# Patient Record
Sex: Male | Born: 1970
Health system: Southern US, Community
[De-identification: ages and names within clinical notes are randomized; demographics above are authoritative.]

## PROBLEM LIST (undated history)

## (undated) DIAGNOSIS — Z89511 Acquired absence of right leg below knee: Secondary | ICD-10-CM

## (undated) DIAGNOSIS — N2581 Secondary hyperparathyroidism of renal origin: Secondary | ICD-10-CM

## (undated) DIAGNOSIS — I779 Disorder of arteries and arterioles, unspecified: Secondary | ICD-10-CM

## (undated) DIAGNOSIS — Z8739 Personal history of other diseases of the musculoskeletal system and connective tissue: Secondary | ICD-10-CM

## (undated) DIAGNOSIS — Z8679 Personal history of other diseases of the circulatory system: Secondary | ICD-10-CM

## (undated) DIAGNOSIS — G4733 Obstructive sleep apnea (adult) (pediatric): Secondary | ICD-10-CM

## (undated) DIAGNOSIS — R112 Nausea with vomiting, unspecified: Secondary | ICD-10-CM

## (undated) DIAGNOSIS — Z9483 Pancreas transplant status: Secondary | ICD-10-CM

## (undated) DIAGNOSIS — Z87448 Personal history of other diseases of urinary system: Secondary | ICD-10-CM

## (undated) DIAGNOSIS — Z7960 Long term (current) use of unspecified immunomodulators and immunosuppressants: Secondary | ICD-10-CM

## (undated) DIAGNOSIS — E109 Type 1 diabetes mellitus without complications: Secondary | ICD-10-CM

## (undated) DIAGNOSIS — Z973 Presence of spectacles and contact lenses: Secondary | ICD-10-CM

## (undated) DIAGNOSIS — Z87828 Personal history of other (healed) physical injury and trauma: Secondary | ICD-10-CM

## (undated) DIAGNOSIS — N183 Chronic kidney disease, stage 3 unspecified: Secondary | ICD-10-CM

## (undated) DIAGNOSIS — N189 Chronic kidney disease, unspecified: Secondary | ICD-10-CM

## (undated) DIAGNOSIS — Z9889 Other specified postprocedural states: Secondary | ICD-10-CM

## (undated) DIAGNOSIS — K219 Gastro-esophageal reflux disease without esophagitis: Secondary | ICD-10-CM

## (undated) DIAGNOSIS — E113593 Type 2 diabetes mellitus with proliferative diabetic retinopathy without macular edema, bilateral: Secondary | ICD-10-CM

## (undated) DIAGNOSIS — N319 Neuromuscular dysfunction of bladder, unspecified: Secondary | ICD-10-CM

## (undated) DIAGNOSIS — Z9641 Presence of insulin pump (external) (internal): Secondary | ICD-10-CM

## (undated) DIAGNOSIS — Z940 Kidney transplant status: Secondary | ICD-10-CM

## (undated) DIAGNOSIS — D631 Anemia in chronic kidney disease: Secondary | ICD-10-CM

## (undated) DIAGNOSIS — F32A Depression, unspecified: Secondary | ICD-10-CM

## (undated) DIAGNOSIS — Z79899 Other long term (current) drug therapy: Secondary | ICD-10-CM

## (undated) DIAGNOSIS — L97509 Non-pressure chronic ulcer of other part of unspecified foot with unspecified severity: Secondary | ICD-10-CM

## (undated) HISTORY — PX: TRACHEOSTOMY: SUR1362

## (undated) HISTORY — PX: DIALYSIS FISTULA CREATION: SHX611

## (undated) HISTORY — PX: COMBINED KIDNEY-PANCREAS TRANSPLANT: SHX1382

## (undated) HISTORY — PX: KIDNEY TRANSPLANT: SHX239

## (undated) HISTORY — PX: BELOW KNEE LEG AMPUTATION: SUR23

---
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
## 1992-02-01 HISTORY — PX: FRACTURE SURGERY: SHX138

## 1992-02-01 HISTORY — PX: OTHER SURGICAL HISTORY: SHX169

## 2001-11-03 ENCOUNTER — Inpatient Hospital Stay (HOSPITAL_COMMUNITY): Admission: EM | Admit: 2001-11-03 | Discharge: 2001-11-09 | Payer: Self-pay | Admitting: Family Medicine

## 2001-11-04 ENCOUNTER — Encounter: Payer: Self-pay | Admitting: Family Medicine

## 2001-11-12 ENCOUNTER — Encounter: Admission: RE | Admit: 2001-11-12 | Discharge: 2001-11-12 | Payer: Self-pay | Admitting: Family Medicine

## 2003-03-04 ENCOUNTER — Encounter (INDEPENDENT_AMBULATORY_CARE_PROVIDER_SITE_OTHER): Payer: Self-pay | Admitting: Cardiology

## 2003-03-04 ENCOUNTER — Inpatient Hospital Stay (HOSPITAL_COMMUNITY): Admission: EM | Admit: 2003-03-04 | Discharge: 2003-03-13 | Payer: Self-pay | Admitting: *Deleted

## 2003-03-11 ENCOUNTER — Encounter: Payer: Self-pay | Admitting: Internal Medicine

## 2003-05-08 ENCOUNTER — Encounter (INDEPENDENT_AMBULATORY_CARE_PROVIDER_SITE_OTHER): Payer: Self-pay | Admitting: *Deleted

## 2003-05-08 ENCOUNTER — Ambulatory Visit (HOSPITAL_COMMUNITY): Admission: RE | Admit: 2003-05-08 | Discharge: 2003-05-08 | Payer: Self-pay | Admitting: *Deleted

## 2003-05-09 ENCOUNTER — Ambulatory Visit (HOSPITAL_COMMUNITY): Admission: RE | Admit: 2003-05-09 | Discharge: 2003-05-09 | Payer: Self-pay | Admitting: Vascular Surgery

## 2003-05-29 ENCOUNTER — Ambulatory Visit (HOSPITAL_COMMUNITY): Admission: RE | Admit: 2003-05-29 | Discharge: 2003-05-29 | Payer: Self-pay | Admitting: Vascular Surgery

## 2003-07-29 ENCOUNTER — Ambulatory Visit (HOSPITAL_COMMUNITY): Admission: RE | Admit: 2003-07-29 | Discharge: 2003-07-29 | Payer: Self-pay | Admitting: Vascular Surgery

## 2003-08-05 ENCOUNTER — Ambulatory Visit (HOSPITAL_COMMUNITY): Admission: RE | Admit: 2003-08-05 | Discharge: 2003-08-05 | Payer: Self-pay | Admitting: Vascular Surgery

## 2003-10-28 ENCOUNTER — Encounter: Admission: RE | Admit: 2003-10-28 | Discharge: 2003-10-28 | Payer: Self-pay | Admitting: Nephrology

## 2003-12-08 ENCOUNTER — Emergency Department (HOSPITAL_COMMUNITY): Admission: EM | Admit: 2003-12-08 | Discharge: 2003-12-08 | Payer: Self-pay | Admitting: Emergency Medicine

## 2004-11-07 ENCOUNTER — Emergency Department (HOSPITAL_COMMUNITY): Admission: EM | Admit: 2004-11-07 | Discharge: 2004-11-07 | Payer: Self-pay | Admitting: Emergency Medicine

## 2004-12-13 ENCOUNTER — Ambulatory Visit (HOSPITAL_BASED_OUTPATIENT_CLINIC_OR_DEPARTMENT_OTHER): Admission: RE | Admit: 2004-12-13 | Discharge: 2004-12-13 | Payer: Self-pay | Admitting: Nephrology

## 2004-12-19 ENCOUNTER — Ambulatory Visit: Payer: Self-pay | Admitting: Internal Medicine

## 2004-12-31 DIAGNOSIS — Z9483 Pancreas transplant status: Secondary | ICD-10-CM

## 2004-12-31 HISTORY — DX: Pancreas transplant status: Z94.83

## 2005-01-26 ENCOUNTER — Ambulatory Visit (HOSPITAL_BASED_OUTPATIENT_CLINIC_OR_DEPARTMENT_OTHER): Admission: RE | Admit: 2005-01-26 | Discharge: 2005-01-26 | Payer: Self-pay | Admitting: Nephrology

## 2005-01-30 ENCOUNTER — Ambulatory Visit: Payer: Self-pay | Admitting: Internal Medicine

## 2006-03-09 ENCOUNTER — Ambulatory Visit (HOSPITAL_COMMUNITY): Admission: RE | Admit: 2006-03-09 | Discharge: 2006-03-09 | Payer: Self-pay | Admitting: Ophthalmology

## 2006-11-24 ENCOUNTER — Emergency Department (HOSPITAL_COMMUNITY): Admission: EM | Admit: 2006-11-24 | Discharge: 2006-11-24 | Payer: Self-pay | Admitting: Emergency Medicine

## 2010-01-31 HISTORY — PX: OTHER SURGICAL HISTORY: SHX169

## 2010-06-18 NOTE — Consult Note (Signed)
NAME:  Tony, Fisher                        ACCOUNT NO.:  [id number]   MEDICAL RECORD NO.:  [id number]                   PATIENT TYPE:  INP   LOCATION:  0164                                 FACILITY:  [facility]   PHYSICIAN:  Donato Heinz, M.D.             DATE OF BIRTH:  1970-05-15   DATE OF CONSULTATION:  03/04/2003  DATE OF DISCHARGE:                                   CONSULTATION   REASON FOR CONSULTATION:  Acute renal failure.   CONSULTING PHYSICIAN:  Dr. Maryland Pink.   HISTORY OF PRESENT ILLNESS:  [name] is a 40 year old white male with  past medical history significant for type 1 diabetes mellitus for 20 years  who was not followed by primary care physician for five years or more.  He  also has a past medical history of chronic kidney disease, stage III, with  baseline creatinine of 2.1 last noted approximately a year ago when he was  hospitalized for an osteomyelitis of his leg.  Since then, he was noted to  have hypertension, but had not been taking blood pressure medications and  had not seen his primary care physician.  He presented to [facility]  Emergency Room with a two month history of increased swelling and a one  month history of increasing shortness of breath, dyspnea on exertion,  orthopnea, PND, and congestion.  In the emergency room, he was noted to have  congestive heart failure, hypoxia with a pulse ox of 90% on 40% face mask,  and also had a BUN and creatinine of 45 and 6.2, respectively.  We were  asked to evaluate the patient for his acute on chronic renal failure.   ALLERGIES:  No known drug allergies.   PAST MEDICAL HISTORY:  1. Insulin-dependent diabetes mellitus for 20 years.  2. Chronic kidney disease, baseline creatinine 2.1 in October 2003,     presumably secondary to diabetic nephropathy.  Creatinine clearance of 54     cc a minute.  He had 1.6 g of protein for 24 hours.  3. Osteomyelitis.  4. Hypertension, not on any medications,  diagnosed a year ago.   OUTPATIENT MEDICATIONS:  Insulin 70/30 50 units b.i.d.   INPATIENT MEDICATIONS:  1. Lasix 80 mg IV q.8h.  2. Hydralazine 50 mg b.i.d.  3. Insulin sliding scale.  4. Nitroglycerin drip.  5. Tylenol p.r.n.   FAMILY HISTORY:  His mother is 29, in good health except she was recently  diagnosed with connective tissue disorder.  Father is alive at age 29, does  not know much about his health.  One sister in good health.  No family  history of kidney disease or heart disease.   SOCIAL HISTORY:  He is single.  Denies tobacco or alcohol.  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  He went to [employer] and then to [employer].  Denies any drug use.  REVIEW OF SYSTEMS:  As per HPI.  He denies any blurry vision, photophobia.  ENT:  Denies any tinnitus, dysphagia, odynophagia.  CARDIAC:  He has some  orthopnea, PND, but no chest pain or palpitations.  PULMONARY:  Has some  shortness of breath, dyspnea on exertion for the last two months.  GASTROINTESTINAL:  No nausea, vomiting, hematochezia, melena, or bright red  blood per rectum.  GENITOURINARY:  No dysuria, pyuria, hematuria, urgency,  frequency, or oliguria.  He has had some swelling of his lower extremities  for the last two months.  HEMATOLOGIC:  Denies any abnormal bleeding or  bruising.  NEUROLOGIC:  Denies any numbness, tingling, weakness, ataxia,  aphasia.  DERMATOLOGIC:  Denies any rashes, lumps, or bumps.  ENDOCRINE:  Denies any pyuria, polydipsia, polyphagia.  INFECTIOUS:  No diaphoresis, no  night sweats, no productive cough.  All other systems negative.   PHYSICAL EXAMINATION:  GENERAL:  This is a well-developed, thin man in mild  respiratory distress.  VITAL SIGNS:  Temperature 97.8, pulse 105, blood pressure 145/100,  respiratory rate is 20, pulse ox is 99% on 50% face mask.  His urine output  is 1720 cc over the last eight hours.  HEENT:  Normocephalic,  atraumatic.  Pupils equal, round, reactive to light.  Extraocular movements were intact.  No icterus.  Oropharynx with dry mucous  membranes.  LUNGS:  He had bibasilar crackles, right greater than left, with decreased  breath sounds at the left base.  CARDIAC:  He is tachycardic at 105.  There is no precordial rub present at  this time.  ABDOMEN:  Normoactive bowel sounds, soft, nontender, nondistended.  EXTREMITIES:  He has 3+ pitting edema up to his thighs.   LABORATORY DATA:  Sodium 141, potassium 5.7, chloride 110, CO2 23, BUN 48,  creatinine 6.4, glucose 220.  White blood cell count 6.6, hemoglobin 10,  platelets 314.  CPK 506, MB 18.7, relative index 3.7.   ASSESSMENT AND PLAN:  1. Acute on chronic renal failure.  It is unclear the underlying     exacerbation, but the patient does have known diabetic nephropathy dating     back to October 2003, when his creatinine clearance was 54 cc a minute     and baseline creatinine was 2.1.  He had 1.6 g of proteinuria for 24     hours at that hospitalization, and he has not seen a physician since that     time, nor was he continuing with blood pressure medications.  At this     present time, his urine output is increased on 80 mg of intravenous Lasix     q.8h.  However, he still has some pulmonary edema and anasarca.  Plan at     this time is to increase his Lasix to 160 mg q.6h., as well as add     Zaroxolyn 5 mg orally to increase his diuresis.  We will also recommend a     renal ultrasound as well as strict I's and O's.  At the present time     there is no emergent indication for dialysis, although it may be in his     very near future.  We will recommend transferring the patient to [facility] when a bed is available in case he will need dialysis in     the near future.  However, we will continue to follow him.  2. Elevated CPKs.  Agree with cardiology consult and  echocardiogram.  Will    need to continue to follow serial  enzymes.  If he will need a cardiac     catheterization in the near future, this will increase his risk for     requiring hemodialysis with superimposed contrasted nephropathy.  3. Hypertension.  He is on hydralazine.  We will continue to follow along     with diuresis.  Would hold off on an ACE or an ARB for now given his     acute on chronic renal failure.  4. Anemia.  This is likely secondary to his chronic kidney disease.  We will     guaiac his stools, check iron studies, and he may benefit from Epogen     therapy.  5. Diabetes mellitus.  Per primary services is on insulin.  We will continue     to follow.  6. Hyperkalemia.  We will follow this after we increase his dose of Lasix     and add Zaroxolyn, and continue to follow.  7. Disposition.  Again, it may be prudent to transfer the patient to [facility] when a bed is available, although he does not need dialysis     at this time, but he may need a heart catheterization depending on     cardiology workup.  We will continue to follow along.                                               Donato Heinz, M.D.    JC/MEDQ  D:  03/04/2003  T:  03/04/2003  Job:  [id number]

## 2010-06-18 NOTE — Op Note (Signed)
NAME:  Tony Fisher, Tony Fisher                        ACCOUNT NO.:  [id number]   MEDICAL RECORD NO.:  [id number]                   PATIENT TYPE:  OIB   LOCATION:  2899                                 FACILITY:  [facility]   PHYSICIAN:  Judeth Cornfield. Scot Dock, M.D.        DATE OF BIRTH:  23-May-1970   DATE OF PROCEDURE:  05/09/2003  DATE OF DISCHARGE:  05/09/2003                                 OPERATIVE REPORT   PREOPERATIVE DIAGNOSIS:  Chronic renal failure.   POSTOPERATIVE DIAGNOSIS:  Chronic renal failure.   PROCEDURE:  1. Ultrasound of bilateral internal jugular veins.  2. Placement of a right IJ Diatek catheter (28 cm).  3. Thrombectomy of right forearm AV fistula.   SURGEON:  Judeth Cornfield. Scot Dock, M.D.   ASSISTANT:  Nurse.   ANESTHESIA:  Local with sedation.   TECHNIQUE:  The patient was taken to the operating room and sedated by  Anesthesia.  I used the ultrasound scanner to identify both internal jugular  veins.  The neck and upper chest were then prepped and draped in the usual  sterile fashion.  After the skin was infiltrated with 1% lidocaine, the  right internal jugular vein was cannulated and a guidewire introduced into  the superior vena cava under fluoroscopic control.  The tract over the wire  was then dilated and then a dilator and peel-away sheath were passed over  the wire and the wire and dilator removed.  The catheter was passed through  the peel-away sheath and positioned in the right atrium.  The exit site of  the catheter was selected and the skin anesthetized between the two areas.  The catheter was then brought through the tunnel, cut to the appropriate  length, and the distal ports were attached.  Both ports withdrew easily,  were then flushed with heparinized saline, and filled with concentrated  heparin.  The catheter was secured at its exit site with a 3-0 nylon suture.  The IJ cannulation site was closed with a 4-0 subcuticular stitch.  A  sterile dressing  was applied.   Next, the right forearm was reprepped and draped in the usual sterile  fashion.  This patient had had a fistula placed a few weeks ago and had been  seen in the office today and it was clotted.  I explained that I thought it  was unlikely, if it clotted this soon, that it would be a good long-term  result, but I thought it was worth a simple thrombectomy.  The previous  incision was anesthetized with 1% lidocaine.  The incision was opened and  the vein was dissected free. A transverse venotomy was made and then using a  #3 Fogarty catheter, the catheter was passed the entire length and the clot  was retrieved,and note, the vein was fairly sclerotic throughout and quite  small.  I also performed a thrombectomy at the arterial end and established  good inflow.  The arteriotomy was then  closed with a running 6-0 Prolene  suture.  At completion, there was a pulse in the fistula; it did not have an  especially good thrill.  This would be consistent with sclerotic disease  throughout the vein proximally.  Hemostasis was obtained in the wound, and  the  heparin was partially reversed with protamine.  The wound was closed with a  4-0 subcuticular stitch.  A sterile dressing was applied.  The patient  tolerated the procedure well and was transferred to the recovery room in  satisfactory condition. All needle and sponge counts were correct.                                               Judeth Cornfield. Scot Dock, M.D.    CSD/MEDQ  D:  05/09/2003  T:  05/11/2003  Job:  [id number]

## 2010-06-18 NOTE — Op Note (Signed)
NAME:  Tony Fisher, Tony Fisher                        ACCOUNT NO.:  [id number]   MEDICAL RECORD NO.:  [id number]                   PATIENT TYPE:  OIB   LOCATION:  2852                                 FACILITY:  [facility]   PHYSICIAN:  Jessy Oto. Fields, MD               DATE OF BIRTH:  05/01/1970   DATE OF PROCEDURE:  05/29/2003  DATE OF DISCHARGE:                                 OPERATIVE REPORT   PREOPERATIVE DIAGNOSIS:  End-stage renal disease.   POSTOPERATIVE DIAGNOSIS:  End-stage renal disease.   PROCEDURE:  Left Cimino fistula.   ANESTHESIA:  Local with IV sedation.   ASSISTANT:  Leta Baptist, P.A.   INDICATIONS:  The patient is a 40 year old male who previously had a right  Cimino fistula which has occluded.  He now presents for placement of a left  Cimino fistula.   OPERATIVE FINDINGS:  1. A 3 mm cephalic vein.  2. A 1.5-2 mm radial artery.   OPERATIVE DETAILS:  After obtaining informed consent, the patient was taken  to the operating room.  The patient was placed in the supine position on the  operating room table.  Next the patient's entire left upper extremity was  prepped and draped in the usual sterile fashion.  Local anesthesia was  infiltrated midway between the cephalic vein and the radial artery.  A  longitudinal incision was made in this location at the level of the wrist.  This was carried down through the subcutaneous tissues.  The cephalic vein  was dissected free circumferentially for approximately 4 cm.  The cephalic  vein was approximately 3 mm in diameter.  Next the radial artery was  dissected free circumferentially on the medial portion of the incision.  The  radial artery was approximately 1.5-2 mm in diameter. This was quite small.  Some papaverine was placed on this to try to decrease spasm.   Next the patient was given 3000 units of intravenous heparin.  The cephalic  vein was then transected distally in the wrist and ligated with a silk tie.  The  vein was then swung over to the level of the artery.  The artery was  controlled proximally and distally with serrefine clamps.  The vein was  dilated up with heparinized saline.  A longitudinal arteriotomy was then  made in the radial artery and an end of vein to side of artery anastomosis  created using a running 7-0 Prolene suture.  Just prior to completion of the  anastomosis, the artery was forebled, backbled, thoroughly flushed.  After  the anastomosis was secure, there was a palpable pulse within the fistula.  There was a good Doppler signal in the fistula all the way up to the level  of the elbow.  Hemostasis was obtained.  The subcutaneous tissues were then  reapproximated using a running 3-0 Vicryl suture.  The skin was then closed  with a  4-0 Vicryl subcuticular  stitch.  The patient tolerated the procedure well,  and there were no complications.  Instrument, sponge, and needle count was  correct at the end of the case.  The patient was taken to the recovery room  in stable condition.                                               Jessy Oto. Fields, MD    CEF/MEDQ  D:  05/29/2003  T:  05/29/2003  Job:  [id number]

## 2010-06-18 NOTE — Procedures (Signed)
Tony Fisher, Tony Fisher             ACCOUNT NO.:  [id number]   MEDICAL RECORD NO.:  [id number]          PATIENT TYPE:  OUT   LOCATION:  SLEEP CENTER                 FACILITY:  [facility]   PHYSICIAN:  Clinton D. Annamaria Boots, M.D. DATE OF BIRTH:  18-Dec-1970   DATE OF STUDY:  12/13/2004                              NOCTURNAL POLYSOMNOGRAM   REFERRING PHYSICIAN:  Dr. Fleet Contras.   DATE OF STUDY:  December 13, 2004.   INDICATION FOR STUDY:  Hypersomnia with sleep apnea.   EPWORTH SLEEPINESS SCORE:  7/24.   BMI:  18.8.   WEIGHT:  147 pounds.   MEDICATIONS:  Include insulin, atenolol and a renal vitamin.   SLEEP ARCHITECTURE:  Total sleep time 392 minutes with sleep efficiency 95%.  Stage I was 2%, stage II 78%, stages III and IV 2%, REM 18% of total sleep  time. Sleep latency 13 minutes, REM latency 172 minutes, awake after sleep  onset 7.5 minutes, arousal index increased at 44. No bedtime medication  taken.   RESPIRATORY DATA:  NPSG protocol. Apnea/hypopnea index (AHI, RDI) 40.1  obstructive events per hour indicating moderately severe obstructive sleep  apnea/hypopnea syndrome. There were 4 central apneas, 190 obstructive  apneas, 16 mixed apneas and 52 hypopneas. Events were positional. While  supine, AHI was 80.4 per hour. REM AHI 5.2 per hour.   OXYGEN DATA:  Moderate to loud snoring with oxygen desaturation to a nadir  of 81% on room air. Mean oxygen saturation through the study was 97% on room  air.   CARDIAC DATA:  Normal sinus rhythm.   MOVEMENT/PARASOMNIA:  A total of 119 limb jerks were reported of which only  4 were associated with arousal or awakening, insignificant impact on sleep.   IMPRESSION/RECOMMENDATIONS:  1.  Moderately severe obstructive sleep apnea/hypopnea syndrome, AHI 40.1      per hour with events more common while supine. Snoring was moderately      loud and oxygen desaturated to 81% during events.  2.  Consider return for C-PAP titration or evaluate  for alternative      therapies as appropriate. Sleeping off flat of back would be a      conservative intervention.      Clinton D. Annamaria Boots, M.D.  [place], [facility]  Electronically Signed     CDY/MEDQ  D:  12/19/2004 10:58:19  T:  12/19/2004 23:15:10  Job:  [id number]
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
## 2010-06-18 NOTE — Op Note (Signed)
NAME:  Tony Fisher, Tony Fisher                        ACCOUNT NO.:  [id number]   MEDICAL RECORD NO.:  [id number]                   PATIENT TYPE:  INP   LOCATION:  4735                                 FACILITY:  [facility]   PHYSICIAN:  Jessy Oto. Fields, MD               DATE OF BIRTH:  1970/04/01   DATE OF PROCEDURE:  03/10/2003  DATE OF DISCHARGE:                                 OPERATIVE REPORT   PROCEDURE PERFORMED:  1. Insertion of left internal jugular vein Diatek catheter.  2. Bilateral neck ultrasound.  3. Insertion of right arm Cimino fistula.   PREOPERATIVE DIAGNOSIS:  End-stage renal disease.   POSTOPERATIVE DIAGNOSIS:  End-stage renal disease.   SURGEON:  Jessy Oto. Fields, MD   ASSISTANT:  Mardene Celeste, N.P.   ANESTHESIA:  Local with IV sedation.   INDICATIONS FOR PROCEDURE:  The patient is a 40 year old male with end-stage  renal disease who requires long term hemodialysis access.   OPERATIVE FINDINGS:  1. A 4 mm right cephalic vein.  2. A patent left internal jugular vein.  3. A 23 cm Diatek catheter.   DESCRIPTION OF PROCEDURE:  After obtaining informed consent, the patient was  taken to the operating room.  The patient was placed in supine position on  the operating table.  Next, the patient's entire neck and chest were prepped  and draped in the usual sterile fashion.  A Site Right ultrasound was used  to localize the right internal jugular vein.  This was small in diameter and  just adjacent to the carotid artery.  Several attempts were made to  cannulate the jugular vein under ultrasound guidance but this was  unsuccessful.  Therefore, attempts on this side were aborted and the Site  Right was moved to the left side.  The left internal jugular vein was noted  to be widely patent and easily compressible with normal respiratory  variability.  Next, local anesthesia was infiltrated over the left internal  jugular vein and this was easily cannulated under  ultrasound guidance.  Next  a 0.035 J-tip guidewire was threaded through the introducer needle using a  modified Seldinger technique and a guidewire was placed down into the  inferior vena cava under fluoroscopic guidance.  Next, sequential 12, 14 and  15 French dilators were placed over the guidewire down into the right  atrium.  The guidewire and dilator were then removed and a 23 cm Diatek  catheter was then placed through the peel-away sheath into the right atrium.  The peel-away sheath was removed and  the catheter was tunneled out  subcutaneously over the left clavicle.  The catheter was cut to length and  the hub attached.  The catheter was noted to flush and draw easily.  The tip  was confirmed under fluoroscopy to be in the right atrium and there were no  kinks throughout the course of the catheter.  The  catheter was then loaded  with concentrated heparin solution.  The catheter was sutured to the skin  with nylon sutures.  The neck insertion site was closed with a Vicryl  stitch.  Dry sterile dressing was applied to the neck.   Next attention was turned to the right arm.  The patient's entire right  upper extremity was prepped and draped in the usual sterile fashion.  A  tourniquet was placed around the forearm to dilate the cephalic vein for  localization.  Next, local anesthesia was infiltrated midway between the  cephalic vein and the radial artery.  An incision was then made in  longitudinal fashion near the wrist.  This was carried down through the  subcutaneous tissues and the cephalic vein was dissected free  circumferentially for approximately 4 cm.  Next, the radial artery was  dissected free just medial to this. The radial artery was quite small and  approximately 1.5 to 2 mm in diameter.  Papaverine was placed on the radial  artery to try to reduce any spasm.  Next, the patient was given 5000 units  of intravenous heparin.  The distal cephalic vein was transected and  swung  over to the area of the radial artery. The radial artery was controlled  proximally and distally with vessel loops.  The patient was given 5000 units  of intravenous heparin prior to clamping the artery.  Next a longitudinal  arteriotomy was made in the radial artery and an end of vein to side of  artery anastomosis was constructed using a running 7-0 Prolene suture.  Prior to completion of the anastomosis the usual flushing procedures were  performed.  The anastomosis was then secured.  Flow was restored to the  distal radial artery and up the fistula and there was noted to be a palpable  thrill within the fistula immediately.  Next, hemostasis was obtained.  The  subcutaneous tissues were then reapproximated with running Vicryl stitch.  The skin was then closed with a 4-0 Vicryl subcuticular stitch.  The patient  tolerated the procedure well.  There were no complications.  The sponge,  needle and instrument counts were correct at the end of the case.  The  patient was then transferred to the recovery room in stable condition.                                               Jessy Oto. Fields, MD    CEF/MEDQ  D:  03/10/2003  T:  03/11/2003  Job:  [id number]

## 2010-06-18 NOTE — Consult Note (Signed)
NAME:  Tony, Fisher                        ACCOUNT NO.:  [id number]   MEDICAL RECORD NO.:  [id number]                   PATIENT TYPE:  INP   LOCATION:  0164                                 FACILITY:  [facility]   PHYSICIAN:  Virgilio Fisher. Tony Dural, MD             DATE OF BIRTH:  03-Sep-1970   DATE OF CONSULTATION:  DATE OF DISCHARGE:                                   CONSULTATION   IDENTIFYING DATA AND CHIEF COMPLAINT:  Tony Fisher is a 40 year old white  man who is admitted to [facility] with acute renal failure and the  complications stemming wherefrom.   HISTORY OF PRESENT ILLNESS:  The patient has a history of Type II diabetes  mellitus.  He presented to the emergency department this evening with a one-  month history of lower extremity edema and a one to two-week history of  progressive dyspnea.  It has come to the point where he has been unable to  sleep due to dyspnea.  He notes orthopnea and paroxysmal nocturnal dyspnea.  There has been no chest pain, heaviness, pressure or squeezing, nor has  there been any pleuritic chest pain.  He denies fever, chills and weight  loss.  There has been no change in his urination.   The patient has no history of cardiac disease including no history of  myocardial infarction, coronary artery disease, congestive heart failure, or  arrhythmia.  There is no history of hypertension, hyperlipidemia, smoking or  family history of early coronary artery disease.   MEDICATIONS:  The patient's only medication is insulin.   ALLERGIES:  The patient is not allergic to any medications.   FAMILY HISTORY:  Family history is unremarkable.  There is no history of  diabetes mellitus or heart disease.   PAST SURGICAL HISTORY:  Shoulder surgery in 1994.   ACCIDENTS AND INJURIES:  Significant injuries; none.   HABITS:  The patient neither smokes or drinks.   SOCIAL HISTORY:  The patient lives alone.  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  His  closest relatives in town are his grandparents.   REVIEW OF SYSTEMS:  Review of systems reveals no problems related to his  head, eyes, ears, nose, mouth, throat, lungs, gastrointestinal system,  genitourinary system, or extremities.  There is no history of neurological  or psychiatric disorder.   PHYSICAL EXAMINATION:  VITAL SIGNS:  Blood pressure 152/111, pulse 110 and regular, respirations  22, and temperature 97.2.  GENERAL APPEARANCE:  The patient is a young white man in mild respiratory  discomfort.  He is alert, oriented, responsive and appropriate.  HEENT:  Head, eyes, nose and mouth are normal.  NECK:  The neck is without thyromegaly.  Carotid pulses are palpable  bilaterally.  Jugular venous distention is normal.  HEART:  Cardiac examination reveals a normal S1 and S2.  There is no gallop  or click.  A friction rub  heard by an earlier examiner is not heard by this  examiner.  Cardiac rhythm is regular.  CHEST:  No chest wall tenderness is noted.  LUNGS:  Examination of the lungs reveals diminished breath sounds diffusely.  There are no rales.  ABDOMEN:  The abdomen is soft and nontender.  There is no mass,  hepatosplenomegaly, bruit, distention, rebound, or rigidity.  RECTAL AND GENITALIA:  Rectal and genital examinations are not performed as  they are not pertinent to the reason for acute care hospitalization.  EXTREMITIES:  Examination of the lower extremities reveals pitting edema to  the lower torso.  NEUROLOGIC EXAMINATION:  Brief screening neurologic survey demonstrates no  focal neurologic findings.   LABORATORY DATA:  The electrocardiogram reveals sinus tachycardia and  nonspecific T-wave flattening in the lateral leads.  the chest radiograph  reveals congestive heart failure and bilateral pleural effusions.  White  count if 7.3 with a hemoglobin of 11.3 and a hematocrit of 33.3.  Potassium  is 5.5, BUN 45 and creatinine 6.2.  The initial CK was 506 with a CK MB  of  18.7 and relative index of 3.7.  Troponin is 0.05.  BNP 1,630.  Urinary  studies are pending at the time of this dictation.   IMPRESSION:  1. Acute renal failure.  2. Congestive heart failure due to fluid overload.  3. Bilateral pleural effusion.  4. Anasarca.  5. Abnormal cardiac enzymes probably due to renal failure and not cardiac     injury.  6. Possible pericardial effusion.  7. Hypertension.  8. Type II diabetes mellitus.  9. Anemia.   RECOMMENDATIONS:  1. Intensive care unit.  2. Serial cardiac enzymes.  3. Blood pressure control - hydralazine intravenous nitroglycerin are good     choices.  4. Pulmonary consult - consider therapeutic thoracentesis.  5. Echocardiogram.  6. Nephrology consult - dialysis.  7. Diuresis.  8. Inputs and outputs, plus daily weights.                                               Virgilio Fisher. Tony Dural, MD    MSC/MEDQ  D:  03/03/2003  T:  03/04/2003  Job:  [id number]   cc:   Annita Brod, M.D.

## 2010-06-18 NOTE — Discharge Summary (Signed)
NAME:  Tony Fisher, Tony Fisher                        ACCOUNT NO.:  [id number]   MEDICAL RECORD NO.:  [id number]                   PATIENT TYPE:  INP   LOCATION:  [id number]                                 FACILITY:  [facility]   PHYSICIAN:  Sheila Oats, M.D.             DATE OF BIRTH:  April 25, 1970   DATE OF ADMISSION:  03/04/2003  DATE OF DISCHARGE:  03/13/2003                                 DISCHARGE SUMMARY   DISCHARGE DIAGNOSES:  1. End-stage renal disease  2. Congestive heart failure.  3. Bilateral pleural effusions.  4. Anasarca.  5. Abnormal cardiac enzymes -- per cardiology, probably due to renal failure     but not cardiac injury.  6. Hypertension.  7. Diabetes mellitus.  8. Anemia.  9. Hyperkalemia.   PROCEDURES:  1. Internal jugular central line/Diatek catheter -- March 10, 2003.  2. Status post new arteriovenous fistula -- right wrist, March 10, 2003.  3. Status post dialysis.  4. Vascular evaluation with ultrasound -- negative for pseudoaneurysm,     apparent hematoma in right wrist.  5. Adenosine stress test -- ejection fraction of 34%, query inferior     infarct, no evidence of ischemia, diffuse hypokinesis.  6. Echocardiogram -- ejection fraction 35% to 40%, moderate mitral     regurgitation.   HISTORY:  The patient is a 40 year old white male with history of type 1  diabetes mellitus, who presented with shortness of breath and dyspnea on  exertion for 1 week, which progressed to shortness of breath at rest.  He  was unable to lie down to sleep -- he had PND and orthopnea, for about a  month.  He reported swelling in his lower extremities for 2 months and  denied any history of similar symptoms.  He denied chest pain and also  denied pleuritic pain.  Tony Fisher also denied fevers, also denied any  change in his urine output.   PHYSICAL EXAMINATION:  Upon admission, the pertinent findings in his exam  included a blood pressure of 132/111, and his O2 saturations 90%  on a 40%  face mask.  His heart rate was 110 and his respiratory rate was 22.  The  neck exam revealed JVP about 11 cm and on his lungs, he had decrease breath  sounds bilaterally with E-to-A changes at the bases.  His heart was regular,  tachycardic, and it was noted that he had a friction rub -- pericardial  apex, also he had pitting edema in his back and 3+ pitting edema up to his  upper thighs.   LABORATORY DATA:  On his labs, he had a BNP of 1630 and the CK was 506, MB  of 19.7, relative index of 3.7 and a troponin of 0.05.  His LFTs were within  normal limits and his BUN was 45 with a creatinine of 6.2.   His EKG showed a sinus tachycardia at a rate of 110 and  he had no acute ST  changes, he had changes consistent with left atrial enlargement and left  axis deviation.   The chest x-ray showed large bilateral pleural effusions and the heart was  not well-visualized.   It was noted that the patient's last creatinine in October of 2003 was 1.7  and the rest of his chemistries showed a sodium of 141, potassium 5.5,  chloride 108 and a CO2 of 25.  His hemoglobin was 11.3 with a hematocrit of  33.3, his white cell count 7.3 and platelet count of 331,000.   HOSPITAL COURSE:  PROBLEM #1 - END-STAGE RENAL DISEASE:  Upon admission, the  patient was discussed with nephrology and Dr. Donato Heinz.  Dr.  Elissa Hefty impression was that the patient had acute-on-chronic renal  failure; he had nondiabetic nephropathy and in October of 2003, had a  creatinine clearance of 54 mL/min and proteinuria of 1.6 g.  The patient had  not seen a physician for a long time and had not continued his blood  pressure medications.  The patient was admitted to the ICU and was started  on Lasix 80 mg IV q.8 h. with an increase in his urine output and the  nephrologist increased the Lasix 160 mg IV q.6 h. and added Zaroxolyn for  better diuresis.  A renal ultrasound was also done and it showed echogenic   kidneys, medical renal disease, no hydronephrosis and the bilateral pleural  effusions were also noted.  The nephrologist also recommended that the  patient be transferred to [facility] and he was subsequently transferred  from [facility] to the [facility] ICU.  As per the above interventions, the  nephrologist determined that Tony Fisher would need long-term diagnosis and  so an internal jugular central line/Diatek catheter was placed, as above,  and subsequently, a new A-V fistula was done by vascular surgery/CVTS.  The  patient was dialyzed during his hospital stay and his last BUN/creatinine  were 16 and 6.1, respectively.  The patient was placed on a renal/diabetic  diet and he was put on a Monday/Wednesday/Friday dialysis schedule and he  was to follow up with Dr. Windy Kalata at the [facility], the Friday after discharge.   PROBLEM #2 - CONGESTIVE HEART FAILURE:  Upon admission, the patient was  started on IV diuretics, as above.  He was also put on supplemental O2 and  serial cardiac enzymes were done and were elevated, as above.  Cardiology  was consulted and Dr. Virgilio Belling. Collman saw the patient and agreed with  the serial cardiac enzymes, blood pressure control with hydralazine and  nitroglycerin.  A 2-D echocardiogram had also been ordered and the results  are as stated above.  The BNP was elevated at 1630 and cardiology followed  the patient and subsequently started him on Coreg and also added Natrecor.  And as mentioned already, the patient was aggressively diuresed as above.  Tony Fisher responded well to this intervention with resolution of his  shortness of breath, and edema/anasarca.  A Cardiolite stress test was done,  in the light of the elevated cardiac enzymes, and the results are as stated  above -- no evidence of ischemia, diffuse hypokinesis and query inferior infarct.  The patient, as stated above, was also subsequently started on an   ACE inhibitor during his hospital stay.  He will also be following up with  [facility] as scheduled.   PROBLEM #3 - BILATERAL PLEURAL EFFUSIONS -- THOUGHT  TO BE SECONDARY TO HIS  RENAL FAILURE:  This improved with the aggressive diuresis as well as  dialysis.   PROBLEM #4 - ANASARCA:  This resolved, as stated above, with aggressive  diuresis and dialysis.   PROBLEM #5 - ABNORMAL CARDIAC ENZYMES:  As stated above, probably due to  renal failure but not cardiac injury.  Stress test with no evidence of  ischemia as above.   PROBLEM #6 - HYPERTENSION:  His blood pressure was initially controlled with  hydralazine and nitroglycerin IV, and later on, these were discontinued and  the patient was started on lisinopril.   PROBLEM #7 - DIABETES, TYPE 1:  The patient's blood sugars were monitored  throughout his hospital stay, he was initially started on his usual  outpatient dose of insulin and covered with a sliding-scale insulin.   PROBLEM #8 - ANEMIA:  This was thought to be secondary to chronic kidney  disease, and he was started on Epogen.   DISCHARGE CONDITION:  Improved.   DISCHARGE DIET:  Renal/diabetic diet.   DISCHARGE MEDICATIONS:  1. Lisinopril 10 mg 1 p.o. daily.  2. Insulin 70/30 -- 12 units subcu b.i.d.  3. Carvedilol 3.125 mg 1 p.o. b.i.d.  4. InFeD 50 mg q.wk. with test dose of 25 mg -- first dialysis.  5. Epogen 4000 units Monday/Wednesday/Friday with dialysis.  6. Nephro-Vite 1 tablet p.o. daily.  7. Calcium carbonate 500 mg 1 t.i.d. with meals.  8. Regular insulin per sliding scale as directed.  9. Percocet 5 mg 1 to 2 tablets p.o. q.4-6 h. p.r.n. pain.  10.      Protonix 40 mg 1 p.o. daily.   FOLLOWUP CARE:  1. The patient is to follow up with Dr. Jessy Oto. Fields in 2 weeks, and     the patient is to call  Dr. Oneida Alar if any problem or with bleeding from     the right fistula.  2. The patient is to follow up with [facility] as scheduled.  3. The  patient is to follow up with Dr. Mercy Moore of [facility] on the Friday following discharge at 11:45 a.m. and dialysis is     every Monday, Wednesday and Friday.  4. The patient preferred to have Dr. Mercy Moore recommend a primary care     physician for him when his insurance finally kicks in in a couple of     months.                                                Sheila Oats, M.D.    ACV/MEDQ  D:  04/13/2003  T:  04/15/2003  Job:  [id number]

## 2010-06-18 NOTE — Procedures (Signed)
NAMEMARRIS, Tony Fisher             ACCOUNT NO.:  [id number]   MEDICAL RECORD NO.:  [id number]          PATIENT TYPE:  OUT   LOCATION:  SLEEP CENTER                 FACILITY:  [facility]   PHYSICIAN:  Clinton D. Annamaria Boots, M.D. DATE OF BIRTH:  [birth year]   DATE OF STUDY:                              NOCTURNAL POLYSOMNOGRAM   REFERRING PHYSICIAN:  Dr. Fleet Contras.   DATE OF STUDY:  January 26, 2005.   INDICATION FOR STUDY:  Hypersomnia with sleep apnea.   EPWORTH SLEEPINESS SCORE:  6/24.   BMI:  19.   WEIGHT:  147 pounds.   A baseline diagnostic NPSG on December 03, 2004 reported an AHI of 40.1 per  hour. C-PAP titration is requested.   HOME MEDICATIONS:  Insulin, atenolol and renal vitamins.   SLEEP ARCHITECTURE:  Total sleep time 404 minutes with sleep efficiency 86%.  Stage I 6%, stage II 76%, stages III and IV absent, REM 18% of total sleep  time. Sleep latency 25 minutes, REM latency 114 minutes, awake after sleep  onset 40 minutes, arousal index 46. No bedtime medication taken.   RESPIRATORY DATA:  C-PAP titration protocol:  C-PAP was titrated to 14 CWP,  AHI 1.2 per hour which prevented snoring. A medium ResMed ultra mirage full  face mask was used with heated humidifier.   OXYGEN DATA:  Moderate to loud snoring at baseline, prevented by final C-PAP  with oxygen saturation on C-PAP held 97-98% on room air.   CARDIAC DATA:  Normal sinus rhythm.   MOVEMENT/PARASOMNIA:  A total of 326 limb jerks were reported of which 53  were associated with arousal or awakening for periodic limb movement with  arousal index of 7.9 per hour which is increased.   IMPRESSION/RECOMMENDATIONS:  1.  Successful C-PAP titration to 14 CWP, AHI 1.2 per hour. A medium ResMed      ultra mirage full face mask was used with heated humidifier.  2.  Baseline NPSG on December 03, 2004 had reported an AHI of 40.1 per hour.  3.  Periodic limb movement with arousal, 7.9 per hour. Consider specific  therapy for this (Requip, Mirapex) if clinically appropriate, after he      has had time to adjust to C-PAP at home.      Clinton D. Annamaria Boots, M.D.  [place] [employer] [facility]  Electronically Signed     CDY/MEDQ  D:  01/30/2005 10:38:15  T:  01/30/2005 16:41:59  Job:  [id number]

## 2010-06-18 NOTE — Discharge Summary (Signed)
NAME:  Tony Fisher, Tony Fisher                        ACCOUNT NO.:  [id number]   MEDICAL RECORD NO.:  [id number]                   PATIENT TYPE:  INP   LOCATION:  5001                                 FACILITY:  [facility]   PHYSICIAN:  Jamal Collin. Hensel, M.D.             DATE OF BIRTH:  27-Jul-1970   DATE OF ADMISSION:  11/03/2001  DATE OF DISCHARGE:  11/09/2001                                 DISCHARGE SUMMARY   DISCHARGE DIAGNOSES:  1. Right foot ulcer with osteomyelitis.  2. Diabetes mellitus, type 1, poorly controlled.  3. Hypertension.  4. Acute renal failure.   PROCEDURES:  None.   CONSULTATIONS:  1. Orthopedic surgery, Monico Blitz. Rhona Raider, M.D.  2. Diabetes coordinator.  3. Nutrition.  4. Social services.   DISCHARGE MEDICATIONS:  1. 70/30 insulin 28 units q.a.m. and 15 units q.p.m.  2. Cipro 250 mg b.i.d. x36 days.  3. Flagyl 500 mg q.i.d. x36 days.  4. Lisinopril 10 mg q.d.  5. Aspirin 325 mg q.d.   SPECIAL INSTRUCTIONS:  The patient is instructed to check is blood sugar  before meals and at bedtime.  He is to record this and show his doctor.  I  have also told him secondary to his blood sugars this morning he needs to  check his sugar at 2 a.m. and if it is above 200 to increase his evening  dose of insulin to 18 units.  He is to care for his wound as instructed by  his nurse and use crutches when ambulating.   DISPOSITION AND FOLLOWUP:  The patient is to be discharged to home and  follow with [facility].  I am in the process of arranging the appointment.  I recommend this patient receive repeat BMP secondary to his acute renal  failure and his high creatinine level.  He will also need followup with the  nutritionist and possibly titrating his insulin doses depending on what his  blood sugars are between hospitalization and follow up with [facility].   HISTORY OF PRESENT ILLNESS:  This is a 40 year old white male with a history  of type 1 diabetes and had not checked  his blood sugar in three years.  He  presented to Urgent Care the day before admission for a painful foot ulcer.  Urgent Care noticed his blood sugar to be above 400 and their machines would  not record it.  They sent him to [facility] to [facility].  He stated that  he had not checked his blood sugar in three years and he took insulin  Regular and NPH and these doses varied depending on what he ate.  He felt  fatigue but no polyphagia or polydipsia and no shortness of breath.  He  stated that he did not see doctors regularly secondary to financial  difficulties.   HOSPITAL COURSE:  1. RIGHT FOOT ULCER WITH OSTEOMYELITIS.  An MRI was performed which  showed     focal cellulitis with osteomyelitis of the calcaneus.  Orthopedic surgery     was consulted, and they did not culture the ulcer or debride it manually.     They recommended enzymatic degradation, debridement.  The patient was     given renal dose of IV Zosyn and the day before discharge was switched to     p.o. Cipro and Flagyl and he will need a six week course of that.  He     remained afebrile throughout hospitalization.  He is to care for the     ulcer by himself at home.  He refuses home health nurse care, and the     nurse will instruct him on how to do that.  He is also to use crutches     when ambulating.   1. DIABETES MELLITUS, TYPE 1.  The patient came in with a blood sugar of     884, and he was started on IV hydration and an insulin drip.  On hospital     day #2, the insulin drip was stopped and he was converted to NPH and     Regular insulin.  His blood sugars remained above 200, and on hospital     day #3, he was changed to 70/30 insulin q. a.m. and q.p.m.  Blood sugars     were monitored throughout this hospitalization, and he was discharged on     a regimen of 70/30 insulin 28 units each morning and 15 units each     evening. He is to check his blood sugars before meals and at bedtime and     report those to his  doctor for followup.  He will need an eye exam and     other diabetes educated as his physician sees pertinent.   1. ACUTE RENAL FAILURE:  On admission, the patient's creatinine was noted to     be 2.9.  After IV hydration, his creatinine trended down and was 1.7 as     the lowest and on the day of discharge was 2.9.  A 24 hour protein was     obtained and that was high and showed some possible diabetic nephropathy.     He will need repeat BMP as an outpatient to follow up on his renal     status, and he is also discharged on an ACE inhibitor for renal     protection.   1. HYPERTENSION.  The patient's blood pressure on admission was 141/88.  He     was started on an ACE inhibitor and his blood pressure improved on the     day of discharge.  His blood pressures were stable between 120-130/75-88.     He will continue the Lisinopril 10 mg q.d.         Sallyanne Havers, M.D.                       William A. Andria Frames, M.D.    AS/MEDQ  D:  11/09/2001  T:  11/12/2001  Job:  [id number]   cc:   [place]  Phone [phone]

## 2010-06-18 NOTE — Op Note (Signed)
NAMETREMONE, MCGEORGE             ACCOUNT NO.:  [id number]   MEDICAL RECORD NO.:  [id number]          PATIENT TYPE:  AMB   LOCATION:  SDS                          FACILITY:  [facility]   PHYSICIAN:  John D. Zigmund Daniel, M.D. DATE OF BIRTH:  07-29-1970   DATE OF PROCEDURE:  03/09/2006  DATE OF DISCHARGE:                               OPERATIVE REPORT   PREOPERATIVE DIAGNOSIS:  Proliferative diabetic retinopathy with  vitreous hemorrhage, both eyes.   SURGEON:  Chrystie Nose. Zigmund Daniel, M.D.   ASSISTANT:  Deatra Ina, MA.   OPERATION/PROCEDURE:  1. Panretinal photocoagulation, right eye.  2. Panretinal photocoagulation, left eye.   DESCRIPTION OF PROCEDURE:  After proper endotracheal anesthesia,  attention was carried to the right eye where the lid speculum was  placed.  The indirect ophthalmoscope laser was moved into place and 1289  burns were placed around the retinal periphery and on areas of  neovascularization with a power 500 milliwatts 0.05 seconds each and  1000 microns in size each.  Ointment was placed in the cul-de-sac and  the eye was closed.  Attention was then carried to the left eye where  lid speculum was placed and 1203 burns were placed in a similar fashion  on neovascularization and into the periphery of the retina.  The power  was 500 milliwatts, duration 0.05 seconds, 1000 microns each in size.  Ointment was placed in the cul-de-sac.  The eye was closed.  The patient  was awakened and taken to recovery in satisfactory condition.  Complications none.  Blood loss zero.      Chrystie Nose. Zigmund Daniel, M.D.  Electronically Signed     JDM/MEDQ  D:  03/09/2006  T:  03/10/2006  Job:  [id number]

## 2010-11-10 LAB — DIFFERENTIAL
Basophils Absolute: 0.1
Basophils Relative: 2 — ABNORMAL HIGH
Monocytes Absolute: 0.4
Neutro Abs: 3.7
Neutrophils Relative %: 73

## 2010-11-10 LAB — COMPREHENSIVE METABOLIC PANEL
Albumin: 3.9
Alkaline Phosphatase: 31 — ABNORMAL LOW
BUN: 11
CO2: 27
Chloride: 110
GFR calc Af Amer: >60
GFR calc non Af Amer: >60
Potassium: 4
Sodium: 143

## 2010-11-10 LAB — CBC
Hemoglobin: 13.2
MCHC: 34
MCV: 84.6
Platelets: 214
RDW: 13.8

## 2010-11-10 LAB — URINALYSIS, ROUTINE W REFLEX MICROSCOPIC
Bilirubin Urine: NEGATIVE
Hgb urine dipstick: NEGATIVE
Protein, ur: NEGATIVE
pH: 6

## 2010-11-10 LAB — RAPID URINE DRUG SCREEN, HOSP PERFORMED
Amphetamines: NOT DETECTED
Benzodiazepines: NOT DETECTED

## 2010-11-17 DIAGNOSIS — Z7960 Long term (current) use of unspecified immunomodulators and immunosuppressants: Secondary | ICD-10-CM | POA: Insufficient documentation

## 2011-02-09 ENCOUNTER — Emergency Department (HOSPITAL_COMMUNITY)
Admission: EM | Admit: 2011-02-09 | Discharge: 2011-02-09 | Disposition: A | Discharge status: Home / Self Care | Payer: Medicare Other | Attending: Emergency Medicine | Admitting: Emergency Medicine

## 2011-02-09 ENCOUNTER — Emergency Department (HOSPITAL_COMMUNITY): Payer: Medicare Other

## 2011-02-09 ENCOUNTER — Encounter (HOSPITAL_COMMUNITY): Payer: Self-pay | Admitting: Emergency Medicine

## 2011-02-09 DIAGNOSIS — L02419 Cutaneous abscess of limb, unspecified: Secondary | ICD-10-CM | POA: Insufficient documentation

## 2011-02-09 DIAGNOSIS — M25476 Effusion, unspecified foot: Secondary | ICD-10-CM | POA: Insufficient documentation

## 2011-02-09 DIAGNOSIS — E119 Type 2 diabetes mellitus without complications: Secondary | ICD-10-CM | POA: Insufficient documentation

## 2011-02-09 DIAGNOSIS — L03119 Cellulitis of unspecified part of limb: Secondary | ICD-10-CM | POA: Insufficient documentation

## 2011-02-09 DIAGNOSIS — M79609 Pain in unspecified limb: Secondary | ICD-10-CM | POA: Insufficient documentation

## 2011-02-09 DIAGNOSIS — Z79899 Other long term (current) drug therapy: Secondary | ICD-10-CM | POA: Insufficient documentation

## 2011-02-09 DIAGNOSIS — Z7982 Long term (current) use of aspirin: Secondary | ICD-10-CM | POA: Insufficient documentation

## 2011-02-09 DIAGNOSIS — M25473 Effusion, unspecified ankle: Secondary | ICD-10-CM | POA: Insufficient documentation

## 2011-02-09 DIAGNOSIS — M7989 Other specified soft tissue disorders: Secondary | ICD-10-CM | POA: Insufficient documentation

## 2011-02-09 DIAGNOSIS — M25579 Pain in unspecified ankle and joints of unspecified foot: Secondary | ICD-10-CM | POA: Insufficient documentation

## 2011-02-09 DIAGNOSIS — R609 Edema, unspecified: Secondary | ICD-10-CM | POA: Insufficient documentation

## 2011-02-09 MED ORDER — CEPHALEXIN 500 MG PO CAPS: 500.0000 mg | ORAL_CAPSULE | Freq: Three times a day (TID) | ORAL | Status: AC

## 2011-02-09 MED ORDER — CEPHALEXIN 250 MG PO CAPS
500.0000 mg | ORAL_CAPSULE | Freq: Once | ORAL | Status: AC
  Administered 2011-02-09: 500 mg via ORAL
  Filled 2011-02-09: qty 2

## 2011-02-09 NOTE — ED Notes (Signed)
Pt st's he woke up this am with pain and swelling in right ankle.  St's he thinks he twisted it last pm going down steps.

## 2011-02-09 NOTE — ED Provider Notes (Signed)
Medical screening examination/treatment/procedure(s) were conducted as a shared visit with non-physician practitioner(s) and myself.  I personally evaluated the patient during the encounter.  Localized swelling confined to the right ankle with tenderness, and induration, but no fluctuance. There is no associated proximal streaking. There is no significant exacerbation of pain with foot eversion. Evaluation is consistent with cellulitis, likely secondary to recent foot debridement procedure.  Richarda Blade, MD 02/09/11 2139

## 2011-02-09 NOTE — ED Provider Notes (Signed)
History     CSN: [id number]  Arrival date & time 02/09/11  1737   First MD Initiated Contact with Patient 02/09/11 2034      Chief Complaint  Patient presents with  . Ankle Pain    (Consider location/radiation/quality/duration/timing/severity/associated sxs/prior treatment) HPI Comments: Patient states that on Sunday.  He was walking on some uneven ground, but does not remember twisting his ankle last night.  He walked down spiral staircase taking 2 steps at a time.  Also, again does not remember twisting his ankle when he woke this morning.  He had right ankle swelling with lateral erythema.  He was seen by Dr. Justin Mend today for his routine three-month checkup.  Post kidney pancreas transplant of 6 years had blood work drawn, but is unaware of results.  Dr. Justin Mend examined.  The ankle asked to see come to the emergency room for an x-ray.  The x-ray is negative for fracture, dislocation.  He does report that he had a chronic ulcer on his heel debrided by his podiatrist approximately 3 weeks ago.  It did have significant bleeding post procedure.  Now he is tender in his heel, especially the lateral aspect  Patient is a 41 y.o. male presenting with ankle pain. The history is provided by the patient.  Ankle Pain  The incident occurred 6 to 12 hours ago. The incident occurred at home. There was no injury mechanism. The pain is present in the right ankle. The pain is at a severity of 2/10. The pain is mild. The pain has been constant since onset. Pertinent negatives include no numbness, no loss of motion, no muscle weakness, no loss of sensation and no tingling. The symptoms are aggravated by activity. He has tried nothing for the symptoms.    Past Medical History  Diagnosis Date  . Diabetes mellitus     Past Surgical History  Procedure Date  . Kidney transplant 2006  . Transplant pancreatic allograft 2006    No family history on file.  History  Substance Use Topics  . Smoking status: Never  Smoker   . Smokeless tobacco: Not on file  . Alcohol Use: Yes     rarely      Review of Systems  Constitutional: Negative for fever and activity change.  Respiratory: Negative.   Cardiovascular: Positive for leg swelling.  Genitourinary: Negative for dysuria.  Neurological: Negative for dizziness, tingling, weakness and numbness.  Hematological: Negative.   Psychiatric/Behavioral: Negative.     Allergies  Ace inhibitors and Cheese  Home Medications   Current Outpatient Rx  Name Route Sig Dispense Refill  . ASPIRIN EC 81 MG PO TBEC Oral Take 81 mg by mouth daily.    Marland Kitchen FLORINEF PO Oral Take 0.1 mg by mouth 2 (two) times daily.    Marland Kitchen MYCOPHENOLATE SODIUM 180 MG PO TBEC Oral Take 720 mg by mouth 2 (two) times daily.    Marland Kitchen PREDNISONE 10 MG PO TABS Oral Take 10 mg by mouth daily.    . SERTRALINE HCL 100 MG PO TABS Oral Take 100 mg by mouth daily.    Marland Kitchen TACROLIMUS 5 MG PO CAPS Oral Take 5 mg by mouth 2 (two) times daily.    . CEPHALEXIN 500 MG PO CAPS Oral Take 1 capsule (500 mg total) by mouth 3 (three) times daily. 30 capsule 0    BP 88/60  Pulse 98  Temp(Src) 98.2 F (36.8 C) (Oral)  Resp 16  SpO2 100%  Physical Exam  Constitutional:  He is oriented to person, place, and time. He appears well-developed and well-nourished.  HENT:  Head: Normocephalic.  Eyes: Pupils are equal, round, and reactive to light.  Cardiovascular: Normal rate.   Pulmonary/Chest: Effort normal.  Musculoskeletal: He exhibits edema and tenderness.       Feet:       Red tender area lateral R ankle  Neurological: He is alert and oriented to person, place, and time.  Skin: Skin is warm and dry. There is pallor.    ED Course  Procedures (including critical care time)  Labs Reviewed - No data to display Dg Ankle Complete Right  02/09/2011  **ADDENDUM** CREATED: 02/09/2011 20:49:46  There is a voice recognition error in the impression section of this report.  The impression should read as follows:  "No  evidence for fracture."  **END ADDENDUM** SIGNED BY: Jake Samples. Tery Sanfilippo, M.D.    02/09/2011  *RADIOLOGY REPORT*  Clinical Data: Right  RIGHT ANKLE - COMPLETE 3+ VIEW  Comparison: None.  Findings: There is no evidence for fracture, subluxation or dislocation.  No worrisome lytic or sclerotic osseous lesion.  IMPRESSION: Evidence for fracture.  Original Report Authenticated By: ERIC A. MANSELL, M.D.     1. Cellulitis of ankle     Had Dr. Beatriz Chancellor come to the bedside for additional eyes on examination, the area has been outlined with permanent skin marker.  The patient will be started on antibiotic.  He will be able to followup tomorrow with his podiatrist and he will call Dr. Justin Mend in the morning as well to check on his lab work  MDM  This is most likely cellulitis        Garald Balding, NP 02/09/11 2135  Garald Balding, NP 02/09/11 2138

## 2011-02-09 NOTE — ED Notes (Signed)
States possibly injuring ankle last pm while jumping down some stairs. Of note: pt has been on steroid therapy for past 6-7 years.

## 2011-02-09 NOTE — ED Notes (Signed)
Pts sister is with the pt.  She is a Cytogeneticist. Surgical RN and on call for the OR.  If the OR calls for her, they will ask for this pt by name. Her cell phone does not pick up in the ED.

## 2011-02-09 NOTE — ED Notes (Signed)
Placed ice pack on rt ankle

## 2011-02-09 NOTE — ED Notes (Signed)
Presents with swollen right ankle. States awakening and noticing swelling and slight tenderness. Cms/rom remains intact. Pedal pulses present. Pt has limited feeling in feer from neuropathy assoc with diabetes. Pt is also kidney/pancreas transplant recipient. Pt in nad.

## 2011-02-10 NOTE — ED Provider Notes (Signed)
Medical screening examination/treatment/procedure(s) were conducted as a shared visit with non-physician practitioner(s) and myself.  I personally evaluated the patient during the encounter  Richarda Blade, MD 02/10/11 [phone]

## 2011-02-25 DIAGNOSIS — N1330 Unspecified hydronephrosis: Secondary | ICD-10-CM | POA: Insufficient documentation

## 2011-03-08 DIAGNOSIS — N183 Chronic kidney disease, stage 3 unspecified: Secondary | ICD-10-CM | POA: Insufficient documentation

## 2011-03-08 DIAGNOSIS — N390 Urinary tract infection, site not specified: Secondary | ICD-10-CM | POA: Insufficient documentation

## 2011-03-08 DIAGNOSIS — A419 Sepsis, unspecified organism: Secondary | ICD-10-CM | POA: Insufficient documentation

## 2011-03-22 DIAGNOSIS — Z8719 Personal history of other diseases of the digestive system: Secondary | ICD-10-CM | POA: Insufficient documentation

## 2011-03-22 DIAGNOSIS — L659 Nonscarring hair loss, unspecified: Secondary | ICD-10-CM | POA: Insufficient documentation

## 2011-05-09 DIAGNOSIS — N135 Crossing vessel and stricture of ureter without hydronephrosis: Secondary | ICD-10-CM | POA: Insufficient documentation

## 2011-08-06 DIAGNOSIS — N289 Disorder of kidney and ureter, unspecified: Secondary | ICD-10-CM | POA: Insufficient documentation

## 2011-08-06 DIAGNOSIS — D638 Anemia in other chronic diseases classified elsewhere: Secondary | ICD-10-CM | POA: Insufficient documentation

## 2011-09-12 DIAGNOSIS — T86891 Other transplanted tissue failure: Secondary | ICD-10-CM | POA: Insufficient documentation

## 2011-11-10 DIAGNOSIS — E1121 Type 2 diabetes mellitus with diabetic nephropathy: Secondary | ICD-10-CM | POA: Insufficient documentation

## 2012-03-20 DIAGNOSIS — R7989 Other specified abnormal findings of blood chemistry: Secondary | ICD-10-CM | POA: Insufficient documentation

## 2012-03-21 DIAGNOSIS — Z9114 Patient's other noncompliance with medication regimen: Secondary | ICD-10-CM | POA: Insufficient documentation

## 2012-03-22 DIAGNOSIS — R739 Hyperglycemia, unspecified: Secondary | ICD-10-CM | POA: Insufficient documentation

## 2012-03-22 DIAGNOSIS — T50905A Adverse effect of unspecified drugs, medicaments and biological substances, initial encounter: Secondary | ICD-10-CM | POA: Insufficient documentation

## 2012-03-27 DIAGNOSIS — T8611 Kidney transplant rejection: Secondary | ICD-10-CM | POA: Insufficient documentation

## 2012-04-13 DIAGNOSIS — R112 Nausea with vomiting, unspecified: Secondary | ICD-10-CM | POA: Insufficient documentation

## 2012-09-22 DIAGNOSIS — I160 Hypertensive urgency: Secondary | ICD-10-CM | POA: Insufficient documentation

## 2013-06-12 DIAGNOSIS — F324 Major depressive disorder, single episode, in partial remission: Secondary | ICD-10-CM | POA: Insufficient documentation

## 2013-07-22 DIAGNOSIS — E103593 Type 1 diabetes mellitus with proliferative diabetic retinopathy without macular edema, bilateral: Secondary | ICD-10-CM | POA: Insufficient documentation

## 2013-07-22 DIAGNOSIS — H52203 Unspecified astigmatism, bilateral: Secondary | ICD-10-CM | POA: Insufficient documentation

## 2013-07-22 DIAGNOSIS — H2513 Age-related nuclear cataract, bilateral: Secondary | ICD-10-CM | POA: Insufficient documentation

## 2013-07-22 DIAGNOSIS — H5213 Myopia, bilateral: Secondary | ICD-10-CM | POA: Insufficient documentation

## 2013-07-23 IMAGING — CR DG ANKLE COMPLETE 3+V*R*
3 series · 3 of 3 positions shown · non-contrast
Comparison: None.
COMPARISON: None.

<!--  IDXRADR:ADDEND:BEGIN -->Addendum Begins
<!--  IDXRADR:ADDEND:INNER_BEGIN -->***ADDENDUM*** CREATED: 02/09/2011 [DATE]

There is a voice recognition error in the impression section of
this report.  The impression should read as follows:  "No evidence
for fracture."
***END ADDENDUM*** SIGNED BY: Edvardas Ir Loreta Editukas, M.D.
CLINICAL DATA: Right
RIGHT ANKLE - COMPLETE 3+ VIEW

[t ankle joint ap right]
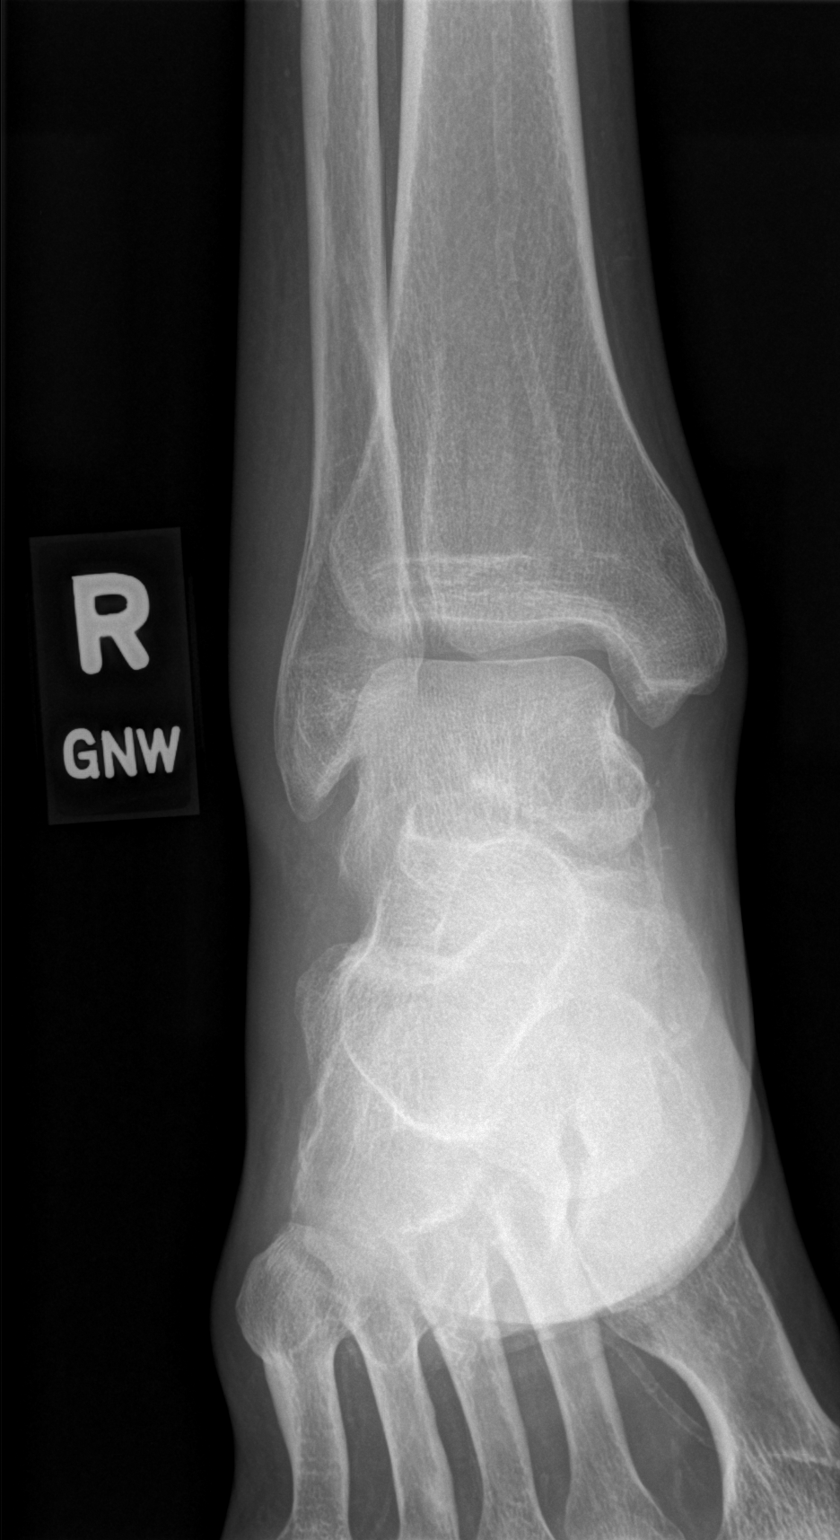

[t ankle joint oblique right]
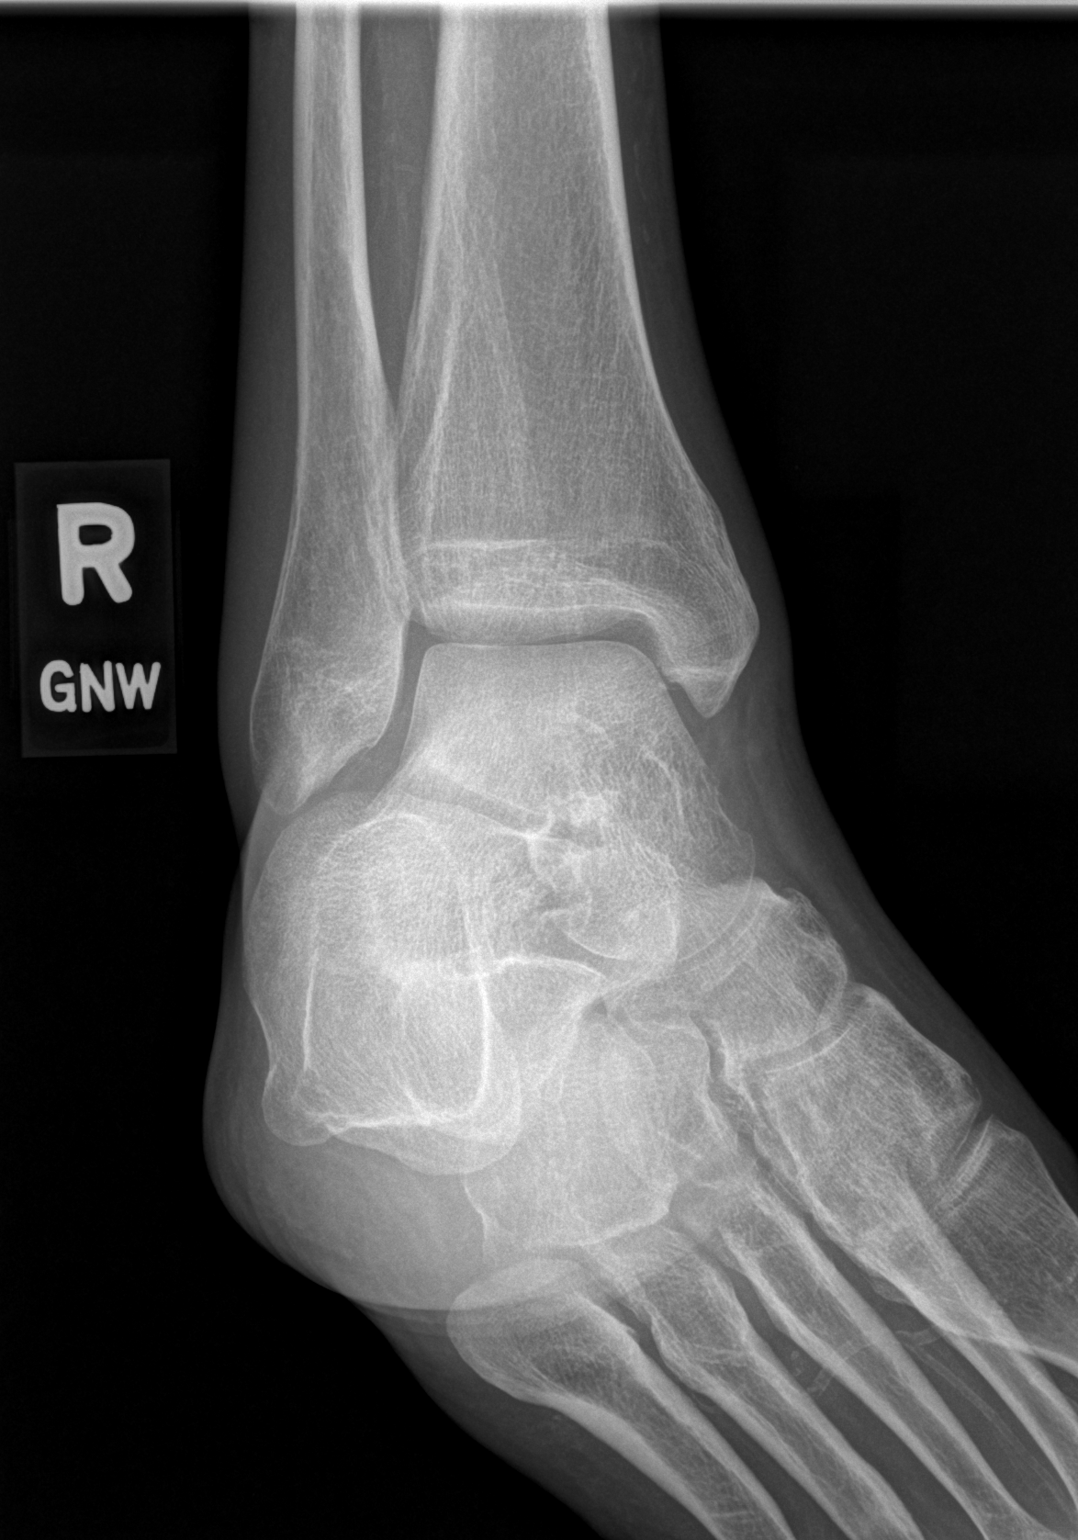

[t ankle joint lat right]
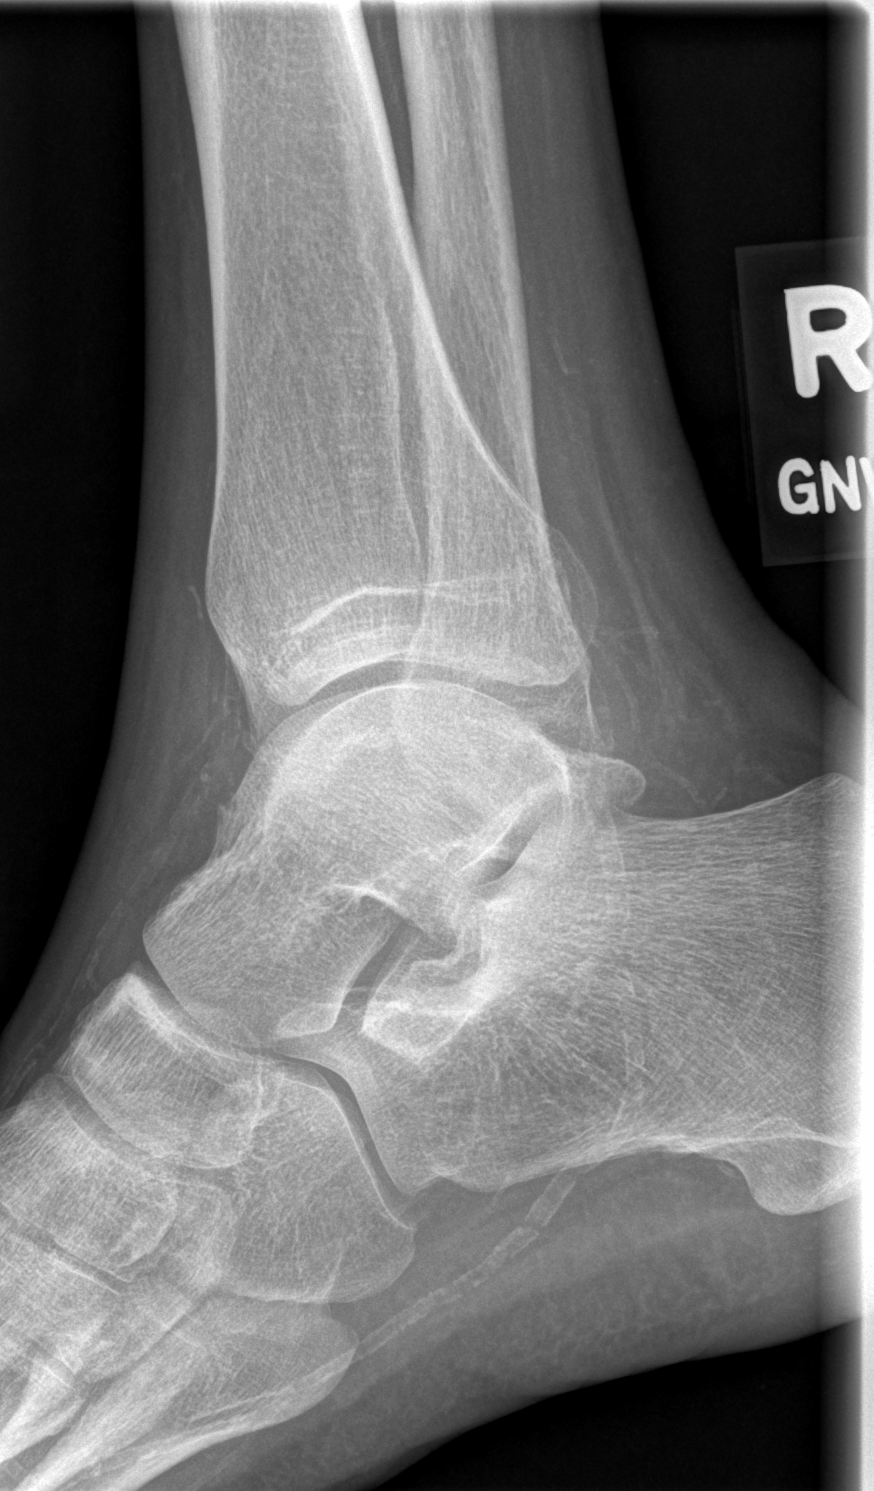

[3 of 3 positions shown; findings below may reference images not displayed]

FINDINGS: There is no evidence for fracture, subluxation or
dislocation.  No worrisome lytic or sclerotic osseous lesion.
IMPRESSION: Evidence for fracture.

<!--  IDXRADR:ADDEND:INNER_END -->Addendum Ends
<!--  IDXRADR:ADDEND:END -->*RADIOLOGY REPORT*
FINDINGS: There is no evidence for fracture, subluxation or
dislocation.  No worrisome lytic or sclerotic osseous lesion.
IMPRESSION: Evidence for fracture.

## 2014-01-31 DIAGNOSIS — E213 Hyperparathyroidism, unspecified: Secondary | ICD-10-CM

## 2014-01-31 HISTORY — DX: Hyperparathyroidism, unspecified: E21.3

## 2014-04-16 DIAGNOSIS — E10621 Type 1 diabetes mellitus with foot ulcer: Secondary | ICD-10-CM | POA: Insufficient documentation

## 2014-04-21 DIAGNOSIS — I739 Peripheral vascular disease, unspecified: Secondary | ICD-10-CM | POA: Insufficient documentation

## 2015-03-05 DIAGNOSIS — N319 Neuromuscular dysfunction of bladder, unspecified: Secondary | ICD-10-CM | POA: Insufficient documentation

## 2015-03-05 DIAGNOSIS — D849 Immunodeficiency, unspecified: Secondary | ICD-10-CM | POA: Insufficient documentation

## 2015-03-09 DIAGNOSIS — I10 Essential (primary) hypertension: Secondary | ICD-10-CM | POA: Insufficient documentation

## 2015-03-25 HISTORY — PX: SVC VENOGRAPHY: CATH118302

## 2015-04-01 DIAGNOSIS — E860 Dehydration: Secondary | ICD-10-CM | POA: Insufficient documentation

## 2015-04-04 DIAGNOSIS — K56609 Unspecified intestinal obstruction, unspecified as to partial versus complete obstruction: Secondary | ICD-10-CM | POA: Insufficient documentation

## 2015-05-12 DIAGNOSIS — Z79899 Other long term (current) drug therapy: Secondary | ICD-10-CM | POA: Insufficient documentation

## 2016-02-05 DIAGNOSIS — Z79899 Other long term (current) drug therapy: Secondary | ICD-10-CM | POA: Diagnosis not present

## 2016-02-05 DIAGNOSIS — E875 Hyperkalemia: Secondary | ICD-10-CM | POA: Diagnosis not present

## 2016-02-05 DIAGNOSIS — E119 Type 2 diabetes mellitus without complications: Secondary | ICD-10-CM | POA: Diagnosis not present

## 2016-02-05 DIAGNOSIS — Z792 Long term (current) use of antibiotics: Secondary | ICD-10-CM | POA: Diagnosis not present

## 2016-02-05 DIAGNOSIS — D899 Disorder involving the immune mechanism, unspecified: Secondary | ICD-10-CM | POA: Diagnosis not present

## 2016-02-05 DIAGNOSIS — I110 Hypertensive heart disease with heart failure: Secondary | ICD-10-CM | POA: Diagnosis not present

## 2016-02-05 DIAGNOSIS — I120 Hypertensive chronic kidney disease with stage 5 chronic kidney disease or end stage renal disease: Secondary | ICD-10-CM | POA: Diagnosis not present

## 2016-02-05 DIAGNOSIS — D8989 Other specified disorders involving the immune mechanism, not elsewhere classified: Secondary | ICD-10-CM | POA: Diagnosis not present

## 2016-02-05 DIAGNOSIS — N186 End stage renal disease: Secondary | ICD-10-CM | POA: Diagnosis not present

## 2016-02-05 DIAGNOSIS — I509 Heart failure, unspecified: Secondary | ICD-10-CM | POA: Diagnosis not present

## 2016-02-05 DIAGNOSIS — Z888 Allergy status to other drugs, medicaments and biological substances status: Secondary | ICD-10-CM | POA: Diagnosis not present

## 2016-02-05 DIAGNOSIS — L97519 Non-pressure chronic ulcer of other part of right foot with unspecified severity: Secondary | ICD-10-CM | POA: Diagnosis not present

## 2016-02-05 DIAGNOSIS — I951 Orthostatic hypotension: Secondary | ICD-10-CM | POA: Diagnosis not present

## 2016-02-05 DIAGNOSIS — N319 Neuromuscular dysfunction of bladder, unspecified: Secondary | ICD-10-CM | POA: Diagnosis not present

## 2016-02-05 DIAGNOSIS — Z794 Long term (current) use of insulin: Secondary | ICD-10-CM | POA: Diagnosis not present

## 2016-02-05 DIAGNOSIS — E1022 Type 1 diabetes mellitus with diabetic chronic kidney disease: Secondary | ICD-10-CM | POA: Diagnosis not present

## 2016-02-05 DIAGNOSIS — Z940 Kidney transplant status: Secondary | ICD-10-CM | POA: Diagnosis not present

## 2016-02-05 DIAGNOSIS — Z7982 Long term (current) use of aspirin: Secondary | ICD-10-CM | POA: Diagnosis not present

## 2016-02-05 DIAGNOSIS — L97819 Non-pressure chronic ulcer of other part of right lower leg with unspecified severity: Secondary | ICD-10-CM | POA: Diagnosis not present

## 2016-03-07 DIAGNOSIS — Z4681 Encounter for fitting and adjustment of insulin pump: Secondary | ICD-10-CM | POA: Diagnosis not present

## 2016-03-07 DIAGNOSIS — E1069 Type 1 diabetes mellitus with other specified complication: Secondary | ICD-10-CM | POA: Diagnosis not present

## 2016-03-09 HISTORY — PX: PANRETINAL PHOTOCOAGULATION: SHX2160

## 2016-03-10 DIAGNOSIS — Z4681 Encounter for fitting and adjustment of insulin pump: Secondary | ICD-10-CM | POA: Diagnosis not present

## 2016-03-10 DIAGNOSIS — E119 Type 2 diabetes mellitus without complications: Secondary | ICD-10-CM | POA: Diagnosis not present

## 2016-03-25 DIAGNOSIS — Z4822 Encounter for aftercare following kidney transplant: Secondary | ICD-10-CM | POA: Diagnosis not present

## 2016-03-25 DIAGNOSIS — Z9889 Other specified postprocedural states: Secondary | ICD-10-CM | POA: Diagnosis not present

## 2016-03-25 DIAGNOSIS — E875 Hyperkalemia: Secondary | ICD-10-CM | POA: Diagnosis not present

## 2016-03-25 DIAGNOSIS — Z792 Long term (current) use of antibiotics: Secondary | ICD-10-CM | POA: Diagnosis not present

## 2016-03-25 DIAGNOSIS — E1022 Type 1 diabetes mellitus with diabetic chronic kidney disease: Secondary | ICD-10-CM | POA: Diagnosis not present

## 2016-03-25 DIAGNOSIS — L97519 Non-pressure chronic ulcer of other part of right foot with unspecified severity: Secondary | ICD-10-CM | POA: Diagnosis not present

## 2016-03-25 DIAGNOSIS — Z9483 Pancreas transplant status: Secondary | ICD-10-CM | POA: Diagnosis not present

## 2016-03-25 DIAGNOSIS — N319 Neuromuscular dysfunction of bladder, unspecified: Secondary | ICD-10-CM | POA: Diagnosis not present

## 2016-03-25 DIAGNOSIS — Z940 Kidney transplant status: Secondary | ICD-10-CM | POA: Diagnosis not present

## 2016-03-25 DIAGNOSIS — E109 Type 1 diabetes mellitus without complications: Secondary | ICD-10-CM | POA: Diagnosis not present

## 2016-03-25 DIAGNOSIS — D8989 Other specified disorders involving the immune mechanism, not elsewhere classified: Secondary | ICD-10-CM | POA: Diagnosis not present

## 2016-03-25 DIAGNOSIS — Z79899 Other long term (current) drug therapy: Secondary | ICD-10-CM | POA: Diagnosis not present

## 2016-03-25 DIAGNOSIS — Z9641 Presence of insulin pump (external) (internal): Secondary | ICD-10-CM | POA: Diagnosis not present

## 2016-03-25 DIAGNOSIS — Z794 Long term (current) use of insulin: Secondary | ICD-10-CM | POA: Diagnosis not present

## 2016-03-29 DIAGNOSIS — H52203 Unspecified astigmatism, bilateral: Secondary | ICD-10-CM | POA: Diagnosis not present

## 2016-03-29 DIAGNOSIS — I110 Hypertensive heart disease with heart failure: Secondary | ICD-10-CM | POA: Diagnosis not present

## 2016-03-29 DIAGNOSIS — I509 Heart failure, unspecified: Secondary | ICD-10-CM | POA: Diagnosis not present

## 2016-03-29 DIAGNOSIS — E1021 Type 1 diabetes mellitus with diabetic nephropathy: Secondary | ICD-10-CM | POA: Diagnosis not present

## 2016-03-29 DIAGNOSIS — E103593 Type 1 diabetes mellitus with proliferative diabetic retinopathy without macular edema, bilateral: Secondary | ICD-10-CM | POA: Diagnosis not present

## 2016-03-29 DIAGNOSIS — H25813 Combined forms of age-related cataract, bilateral: Secondary | ICD-10-CM | POA: Diagnosis not present

## 2016-03-29 DIAGNOSIS — Z794 Long term (current) use of insulin: Secondary | ICD-10-CM | POA: Diagnosis not present

## 2016-03-29 DIAGNOSIS — Z9641 Presence of insulin pump (external) (internal): Secondary | ICD-10-CM | POA: Diagnosis not present

## 2016-03-29 DIAGNOSIS — H5213 Myopia, bilateral: Secondary | ICD-10-CM | POA: Diagnosis not present

## 2016-03-29 DIAGNOSIS — E1036 Type 1 diabetes mellitus with diabetic cataract: Secondary | ICD-10-CM | POA: Diagnosis not present

## 2016-03-29 DIAGNOSIS — Z940 Kidney transplant status: Secondary | ICD-10-CM | POA: Diagnosis not present

## 2016-03-29 DIAGNOSIS — H524 Presbyopia: Secondary | ICD-10-CM | POA: Diagnosis not present

## 2016-03-29 DIAGNOSIS — Z9483 Pancreas transplant status: Secondary | ICD-10-CM | POA: Diagnosis not present

## 2016-03-29 DIAGNOSIS — E1042 Type 1 diabetes mellitus with diabetic polyneuropathy: Secondary | ICD-10-CM | POA: Diagnosis not present

## 2016-04-07 DIAGNOSIS — E1021 Type 1 diabetes mellitus with diabetic nephropathy: Secondary | ICD-10-CM | POA: Diagnosis not present

## 2016-04-15 DIAGNOSIS — Z940 Kidney transplant status: Secondary | ICD-10-CM | POA: Diagnosis not present

## 2016-04-22 DIAGNOSIS — L97519 Non-pressure chronic ulcer of other part of right foot with unspecified severity: Secondary | ICD-10-CM | POA: Diagnosis not present

## 2016-04-22 DIAGNOSIS — T86891 Other transplanted tissue failure: Secondary | ICD-10-CM | POA: Diagnosis not present

## 2016-04-22 DIAGNOSIS — Z79899 Other long term (current) drug therapy: Secondary | ICD-10-CM | POA: Diagnosis not present

## 2016-04-22 DIAGNOSIS — D8989 Other specified disorders involving the immune mechanism, not elsewhere classified: Secondary | ICD-10-CM | POA: Diagnosis not present

## 2016-04-22 DIAGNOSIS — I120 Hypertensive chronic kidney disease with stage 5 chronic kidney disease or end stage renal disease: Secondary | ICD-10-CM | POA: Diagnosis not present

## 2016-04-22 DIAGNOSIS — N186 End stage renal disease: Secondary | ICD-10-CM | POA: Diagnosis not present

## 2016-04-22 DIAGNOSIS — Z9641 Presence of insulin pump (external) (internal): Secondary | ICD-10-CM | POA: Diagnosis not present

## 2016-04-22 DIAGNOSIS — Z792 Long term (current) use of antibiotics: Secondary | ICD-10-CM | POA: Diagnosis not present

## 2016-04-22 DIAGNOSIS — Z4822 Encounter for aftercare following kidney transplant: Secondary | ICD-10-CM | POA: Diagnosis not present

## 2016-04-22 DIAGNOSIS — Z794 Long term (current) use of insulin: Secondary | ICD-10-CM | POA: Diagnosis not present

## 2016-04-22 DIAGNOSIS — E875 Hyperkalemia: Secondary | ICD-10-CM | POA: Diagnosis not present

## 2016-04-22 DIAGNOSIS — E119 Type 2 diabetes mellitus without complications: Secondary | ICD-10-CM | POA: Diagnosis not present

## 2016-04-22 DIAGNOSIS — E1022 Type 1 diabetes mellitus with diabetic chronic kidney disease: Secondary | ICD-10-CM | POA: Diagnosis not present

## 2016-04-22 DIAGNOSIS — N319 Neuromuscular dysfunction of bladder, unspecified: Secondary | ICD-10-CM | POA: Diagnosis not present

## 2016-04-22 DIAGNOSIS — Z7952 Long term (current) use of systemic steroids: Secondary | ICD-10-CM | POA: Diagnosis not present

## 2016-04-22 DIAGNOSIS — Z940 Kidney transplant status: Secondary | ICD-10-CM | POA: Diagnosis not present

## 2016-04-22 DIAGNOSIS — Z9483 Pancreas transplant status: Secondary | ICD-10-CM | POA: Diagnosis not present

## 2016-04-29 DIAGNOSIS — Z940 Kidney transplant status: Secondary | ICD-10-CM | POA: Diagnosis not present

## 2016-06-01 DIAGNOSIS — Z992 Dependence on renal dialysis: Secondary | ICD-10-CM | POA: Diagnosis not present

## 2016-06-01 DIAGNOSIS — Z8679 Personal history of other diseases of the circulatory system: Secondary | ICD-10-CM | POA: Diagnosis not present

## 2016-06-01 DIAGNOSIS — Z792 Long term (current) use of antibiotics: Secondary | ICD-10-CM | POA: Diagnosis not present

## 2016-06-01 DIAGNOSIS — Z7982 Long term (current) use of aspirin: Secondary | ICD-10-CM | POA: Diagnosis not present

## 2016-06-01 DIAGNOSIS — Z79899 Other long term (current) drug therapy: Secondary | ICD-10-CM | POA: Diagnosis not present

## 2016-06-01 DIAGNOSIS — E875 Hyperkalemia: Secondary | ICD-10-CM | POA: Diagnosis not present

## 2016-06-01 DIAGNOSIS — I509 Heart failure, unspecified: Secondary | ICD-10-CM | POA: Diagnosis not present

## 2016-06-01 DIAGNOSIS — E1022 Type 1 diabetes mellitus with diabetic chronic kidney disease: Secondary | ICD-10-CM | POA: Diagnosis not present

## 2016-06-01 DIAGNOSIS — L97919 Non-pressure chronic ulcer of unspecified part of right lower leg with unspecified severity: Secondary | ICD-10-CM | POA: Diagnosis not present

## 2016-06-01 DIAGNOSIS — Z940 Kidney transplant status: Secondary | ICD-10-CM | POA: Diagnosis not present

## 2016-06-01 DIAGNOSIS — Z888 Allergy status to other drugs, medicaments and biological substances status: Secondary | ICD-10-CM | POA: Diagnosis not present

## 2016-06-01 DIAGNOSIS — N319 Neuromuscular dysfunction of bladder, unspecified: Secondary | ICD-10-CM | POA: Diagnosis not present

## 2016-06-01 DIAGNOSIS — Z794 Long term (current) use of insulin: Secondary | ICD-10-CM | POA: Diagnosis not present

## 2016-06-01 DIAGNOSIS — I132 Hypertensive heart and chronic kidney disease with heart failure and with stage 5 chronic kidney disease, or end stage renal disease: Secondary | ICD-10-CM | POA: Diagnosis not present

## 2016-06-01 DIAGNOSIS — Z9483 Pancreas transplant status: Secondary | ICD-10-CM | POA: Diagnosis not present

## 2016-06-01 DIAGNOSIS — Z9641 Presence of insulin pump (external) (internal): Secondary | ICD-10-CM | POA: Diagnosis not present

## 2016-06-01 DIAGNOSIS — Z4822 Encounter for aftercare following kidney transplant: Secondary | ICD-10-CM | POA: Diagnosis not present

## 2016-06-01 DIAGNOSIS — E103593 Type 1 diabetes mellitus with proliferative diabetic retinopathy without macular edema, bilateral: Secondary | ICD-10-CM | POA: Diagnosis not present

## 2016-06-01 DIAGNOSIS — D899 Disorder involving the immune mechanism, unspecified: Secondary | ICD-10-CM | POA: Diagnosis not present

## 2016-06-01 DIAGNOSIS — E10621 Type 1 diabetes mellitus with foot ulcer: Secondary | ICD-10-CM | POA: Diagnosis not present

## 2016-06-01 DIAGNOSIS — D8989 Other specified disorders involving the immune mechanism, not elsewhere classified: Secondary | ICD-10-CM | POA: Diagnosis not present

## 2016-06-01 DIAGNOSIS — L97519 Non-pressure chronic ulcer of other part of right foot with unspecified severity: Secondary | ICD-10-CM | POA: Diagnosis not present

## 2016-06-01 DIAGNOSIS — N186 End stage renal disease: Secondary | ICD-10-CM | POA: Diagnosis not present

## 2016-06-01 DIAGNOSIS — K56609 Unspecified intestinal obstruction, unspecified as to partial versus complete obstruction: Secondary | ICD-10-CM | POA: Diagnosis not present

## 2016-06-01 DIAGNOSIS — T86891 Other transplanted tissue failure: Secondary | ICD-10-CM | POA: Diagnosis not present

## 2016-06-09 DIAGNOSIS — E1021 Type 1 diabetes mellitus with diabetic nephropathy: Secondary | ICD-10-CM | POA: Diagnosis not present

## 2016-06-16 DIAGNOSIS — Z79899 Other long term (current) drug therapy: Secondary | ICD-10-CM | POA: Diagnosis not present

## 2016-06-23 DIAGNOSIS — E1069 Type 1 diabetes mellitus with other specified complication: Secondary | ICD-10-CM | POA: Diagnosis not present

## 2016-06-29 DIAGNOSIS — Z792 Long term (current) use of antibiotics: Secondary | ICD-10-CM | POA: Diagnosis not present

## 2016-06-29 DIAGNOSIS — Z79899 Other long term (current) drug therapy: Secondary | ICD-10-CM | POA: Diagnosis not present

## 2016-06-29 DIAGNOSIS — Z9889 Other specified postprocedural states: Secondary | ICD-10-CM | POA: Diagnosis not present

## 2016-06-29 DIAGNOSIS — I10 Essential (primary) hypertension: Secondary | ICD-10-CM | POA: Diagnosis not present

## 2016-06-29 DIAGNOSIS — N179 Acute kidney failure, unspecified: Secondary | ICD-10-CM | POA: Diagnosis not present

## 2016-06-29 DIAGNOSIS — Z79621 Long term (current) use of calcineurin inhibitor: Secondary | ICD-10-CM | POA: Insufficient documentation

## 2016-06-29 DIAGNOSIS — D631 Anemia in chronic kidney disease: Secondary | ICD-10-CM | POA: Diagnosis not present

## 2016-06-29 DIAGNOSIS — Z794 Long term (current) use of insulin: Secondary | ICD-10-CM | POA: Diagnosis not present

## 2016-06-29 DIAGNOSIS — Z888 Allergy status to other drugs, medicaments and biological substances status: Secondary | ICD-10-CM | POA: Diagnosis not present

## 2016-06-29 DIAGNOSIS — Z9114 Patient's other noncompliance with medication regimen: Secondary | ICD-10-CM | POA: Diagnosis not present

## 2016-06-29 DIAGNOSIS — E875 Hyperkalemia: Secondary | ICD-10-CM | POA: Diagnosis not present

## 2016-06-29 DIAGNOSIS — E1022 Type 1 diabetes mellitus with diabetic chronic kidney disease: Secondary | ICD-10-CM | POA: Diagnosis not present

## 2016-06-29 DIAGNOSIS — Z992 Dependence on renal dialysis: Secondary | ICD-10-CM | POA: Diagnosis not present

## 2016-06-29 DIAGNOSIS — E1051 Type 1 diabetes mellitus with diabetic peripheral angiopathy without gangrene: Secondary | ICD-10-CM | POA: Diagnosis not present

## 2016-06-29 DIAGNOSIS — Z9641 Presence of insulin pump (external) (internal): Secondary | ICD-10-CM | POA: Diagnosis not present

## 2016-06-29 DIAGNOSIS — E119 Type 2 diabetes mellitus without complications: Secondary | ICD-10-CM | POA: Diagnosis not present

## 2016-06-29 DIAGNOSIS — Z9483 Pancreas transplant status: Secondary | ICD-10-CM | POA: Diagnosis not present

## 2016-06-29 DIAGNOSIS — Z4822 Encounter for aftercare following kidney transplant: Secondary | ICD-10-CM | POA: Insufficient documentation

## 2016-06-29 DIAGNOSIS — I509 Heart failure, unspecified: Secondary | ICD-10-CM | POA: Diagnosis not present

## 2016-06-29 DIAGNOSIS — Z7982 Long term (current) use of aspirin: Secondary | ICD-10-CM | POA: Diagnosis not present

## 2016-06-29 DIAGNOSIS — I132 Hypertensive heart and chronic kidney disease with heart failure and with stage 5 chronic kidney disease, or end stage renal disease: Secondary | ICD-10-CM | POA: Diagnosis not present

## 2016-06-29 DIAGNOSIS — L97519 Non-pressure chronic ulcer of other part of right foot with unspecified severity: Secondary | ICD-10-CM | POA: Diagnosis not present

## 2016-06-29 DIAGNOSIS — D8989 Other specified disorders involving the immune mechanism, not elsewhere classified: Secondary | ICD-10-CM | POA: Diagnosis not present

## 2016-06-29 DIAGNOSIS — Z940 Kidney transplant status: Secondary | ICD-10-CM | POA: Diagnosis not present

## 2016-06-29 DIAGNOSIS — E103593 Type 1 diabetes mellitus with proliferative diabetic retinopathy without macular edema, bilateral: Secondary | ICD-10-CM | POA: Diagnosis not present

## 2016-06-29 DIAGNOSIS — N186 End stage renal disease: Secondary | ICD-10-CM | POA: Diagnosis not present

## 2016-06-29 DIAGNOSIS — N319 Neuromuscular dysfunction of bladder, unspecified: Secondary | ICD-10-CM | POA: Diagnosis not present

## 2016-06-29 DIAGNOSIS — E1065 Type 1 diabetes mellitus with hyperglycemia: Secondary | ICD-10-CM | POA: Diagnosis not present

## 2016-06-29 DIAGNOSIS — E10621 Type 1 diabetes mellitus with foot ulcer: Secondary | ICD-10-CM | POA: Diagnosis not present

## 2016-06-29 DIAGNOSIS — D899 Disorder involving the immune mechanism, unspecified: Secondary | ICD-10-CM | POA: Diagnosis not present

## 2016-07-13 DIAGNOSIS — Z4822 Encounter for aftercare following kidney transplant: Secondary | ICD-10-CM | POA: Diagnosis not present

## 2016-08-16 DIAGNOSIS — E10319 Type 1 diabetes mellitus with unspecified diabetic retinopathy without macular edema: Secondary | ICD-10-CM | POA: Diagnosis not present

## 2016-08-16 DIAGNOSIS — Z9641 Presence of insulin pump (external) (internal): Secondary | ICD-10-CM | POA: Diagnosis not present

## 2016-08-16 DIAGNOSIS — E1022 Type 1 diabetes mellitus with diabetic chronic kidney disease: Secondary | ICD-10-CM | POA: Diagnosis not present

## 2016-08-16 DIAGNOSIS — E1042 Type 1 diabetes mellitus with diabetic polyneuropathy: Secondary | ICD-10-CM | POA: Diagnosis not present

## 2016-08-16 DIAGNOSIS — N186 End stage renal disease: Secondary | ICD-10-CM | POA: Diagnosis not present

## 2016-08-16 DIAGNOSIS — Z940 Kidney transplant status: Secondary | ICD-10-CM | POA: Diagnosis not present

## 2016-08-16 DIAGNOSIS — E108 Type 1 diabetes mellitus with unspecified complications: Secondary | ICD-10-CM | POA: Diagnosis not present

## 2016-08-16 DIAGNOSIS — Z794 Long term (current) use of insulin: Secondary | ICD-10-CM | POA: Diagnosis not present

## 2016-08-31 DIAGNOSIS — Z89511 Acquired absence of right leg below knee: Secondary | ICD-10-CM

## 2016-08-31 HISTORY — DX: Acquired absence of right leg below knee: Z89.511

## 2016-09-01 DIAGNOSIS — I110 Hypertensive heart disease with heart failure: Secondary | ICD-10-CM | POA: Diagnosis not present

## 2016-09-01 DIAGNOSIS — I509 Heart failure, unspecified: Secondary | ICD-10-CM | POA: Diagnosis not present

## 2016-09-01 DIAGNOSIS — L03115 Cellulitis of right lower limb: Secondary | ICD-10-CM | POA: Diagnosis not present

## 2016-09-01 DIAGNOSIS — Z940 Kidney transplant status: Secondary | ICD-10-CM | POA: Diagnosis not present

## 2016-09-01 DIAGNOSIS — F419 Anxiety disorder, unspecified: Secondary | ICD-10-CM | POA: Diagnosis not present

## 2016-09-01 DIAGNOSIS — N179 Acute kidney failure, unspecified: Secondary | ICD-10-CM | POA: Diagnosis not present

## 2016-09-01 DIAGNOSIS — R000 Tachycardia, unspecified: Secondary | ICD-10-CM | POA: Diagnosis not present

## 2016-09-01 DIAGNOSIS — E10621 Type 1 diabetes mellitus with foot ulcer: Secondary | ICD-10-CM | POA: Diagnosis not present

## 2016-09-01 DIAGNOSIS — Z79899 Other long term (current) drug therapy: Secondary | ICD-10-CM | POA: Diagnosis not present

## 2016-09-01 DIAGNOSIS — Z9641 Presence of insulin pump (external) (internal): Secondary | ICD-10-CM | POA: Diagnosis not present

## 2016-09-01 DIAGNOSIS — L97519 Non-pressure chronic ulcer of other part of right foot with unspecified severity: Secondary | ICD-10-CM | POA: Diagnosis not present

## 2016-09-02 DIAGNOSIS — E10621 Type 1 diabetes mellitus with foot ulcer: Secondary | ICD-10-CM | POA: Diagnosis present

## 2016-09-02 DIAGNOSIS — Z79899 Other long term (current) drug therapy: Secondary | ICD-10-CM | POA: Diagnosis not present

## 2016-09-02 DIAGNOSIS — T8619 Other complication of kidney transplant: Secondary | ICD-10-CM | POA: Diagnosis present

## 2016-09-02 DIAGNOSIS — E10628 Type 1 diabetes mellitus with other skin complications: Secondary | ICD-10-CM | POA: Diagnosis present

## 2016-09-02 DIAGNOSIS — E878 Other disorders of electrolyte and fluid balance, not elsewhere classified: Secondary | ICD-10-CM | POA: Diagnosis not present

## 2016-09-02 DIAGNOSIS — S91301A Unspecified open wound, right foot, initial encounter: Secondary | ICD-10-CM | POA: Diagnosis not present

## 2016-09-02 DIAGNOSIS — E1042 Type 1 diabetes mellitus with diabetic polyneuropathy: Secondary | ICD-10-CM | POA: Diagnosis not present

## 2016-09-02 DIAGNOSIS — L02612 Cutaneous abscess of left foot: Secondary | ICD-10-CM | POA: Diagnosis not present

## 2016-09-02 DIAGNOSIS — Z9483 Pancreas transplant status: Secondary | ICD-10-CM | POA: Diagnosis not present

## 2016-09-02 DIAGNOSIS — I771 Stricture of artery: Secondary | ICD-10-CM | POA: Diagnosis not present

## 2016-09-02 DIAGNOSIS — M869 Osteomyelitis, unspecified: Secondary | ICD-10-CM | POA: Diagnosis not present

## 2016-09-02 DIAGNOSIS — B952 Enterococcus as the cause of diseases classified elsewhere: Secondary | ICD-10-CM | POA: Diagnosis present

## 2016-09-02 DIAGNOSIS — L97412 Non-pressure chronic ulcer of right heel and midfoot with fat layer exposed: Secondary | ICD-10-CM | POA: Diagnosis not present

## 2016-09-02 DIAGNOSIS — Z9641 Presence of insulin pump (external) (internal): Secondary | ICD-10-CM | POA: Diagnosis not present

## 2016-09-02 DIAGNOSIS — G8918 Other acute postprocedural pain: Secondary | ICD-10-CM | POA: Diagnosis not present

## 2016-09-02 DIAGNOSIS — I10 Essential (primary) hypertension: Secondary | ICD-10-CM | POA: Diagnosis present

## 2016-09-02 DIAGNOSIS — H52203 Unspecified astigmatism, bilateral: Secondary | ICD-10-CM | POA: Diagnosis present

## 2016-09-02 DIAGNOSIS — E11621 Type 2 diabetes mellitus with foot ulcer: Secondary | ICD-10-CM | POA: Diagnosis not present

## 2016-09-02 DIAGNOSIS — B954 Other streptococcus as the cause of diseases classified elsewhere: Secondary | ICD-10-CM | POA: Diagnosis present

## 2016-09-02 DIAGNOSIS — L97413 Non-pressure chronic ulcer of right heel and midfoot with necrosis of muscle: Secondary | ICD-10-CM | POA: Diagnosis not present

## 2016-09-02 DIAGNOSIS — M79671 Pain in right foot: Secondary | ICD-10-CM | POA: Diagnosis not present

## 2016-09-02 DIAGNOSIS — Z89511 Acquired absence of right leg below knee: Secondary | ICD-10-CM | POA: Diagnosis not present

## 2016-09-02 DIAGNOSIS — R339 Retention of urine, unspecified: Secondary | ICD-10-CM | POA: Diagnosis not present

## 2016-09-02 DIAGNOSIS — M86671 Other chronic osteomyelitis, right ankle and foot: Secondary | ICD-10-CM | POA: Diagnosis not present

## 2016-09-02 DIAGNOSIS — L97411 Non-pressure chronic ulcer of right heel and midfoot limited to breakdown of skin: Secondary | ICD-10-CM | POA: Diagnosis present

## 2016-09-02 DIAGNOSIS — M799 Soft tissue disorder, unspecified: Secondary | ICD-10-CM | POA: Diagnosis not present

## 2016-09-02 DIAGNOSIS — I998 Other disorder of circulatory system: Secondary | ICD-10-CM | POA: Diagnosis not present

## 2016-09-02 DIAGNOSIS — L97423 Non-pressure chronic ulcer of left heel and midfoot with necrosis of muscle: Secondary | ICD-10-CM | POA: Diagnosis not present

## 2016-09-02 DIAGNOSIS — E1051 Type 1 diabetes mellitus with diabetic peripheral angiopathy without gangrene: Secondary | ICD-10-CM | POA: Diagnosis present

## 2016-09-02 DIAGNOSIS — E10319 Type 1 diabetes mellitus with unspecified diabetic retinopathy without macular edema: Secondary | ICD-10-CM | POA: Diagnosis present

## 2016-09-02 DIAGNOSIS — L02611 Cutaneous abscess of right foot: Secondary | ICD-10-CM | POA: Diagnosis present

## 2016-09-02 DIAGNOSIS — F419 Anxiety disorder, unspecified: Secondary | ICD-10-CM | POA: Diagnosis present

## 2016-09-02 DIAGNOSIS — E109 Type 1 diabetes mellitus without complications: Secondary | ICD-10-CM | POA: Diagnosis not present

## 2016-09-02 DIAGNOSIS — I509 Heart failure, unspecified: Secondary | ICD-10-CM | POA: Diagnosis not present

## 2016-09-02 DIAGNOSIS — L97418 Non-pressure chronic ulcer of right heel and midfoot with other specified severity: Secondary | ICD-10-CM | POA: Diagnosis not present

## 2016-09-02 DIAGNOSIS — R000 Tachycardia, unspecified: Secondary | ICD-10-CM | POA: Diagnosis present

## 2016-09-02 DIAGNOSIS — E1069 Type 1 diabetes mellitus with other specified complication: Secondary | ICD-10-CM | POA: Diagnosis not present

## 2016-09-02 DIAGNOSIS — Z940 Kidney transplant status: Secondary | ICD-10-CM | POA: Diagnosis not present

## 2016-09-02 DIAGNOSIS — D899 Disorder involving the immune mechanism, unspecified: Secondary | ICD-10-CM | POA: Diagnosis not present

## 2016-09-02 DIAGNOSIS — K219 Gastro-esophageal reflux disease without esophagitis: Secondary | ICD-10-CM | POA: Diagnosis present

## 2016-09-02 DIAGNOSIS — M79604 Pain in right leg: Secondary | ICD-10-CM | POA: Diagnosis not present

## 2016-09-02 DIAGNOSIS — L089 Local infection of the skin and subcutaneous tissue, unspecified: Secondary | ICD-10-CM | POA: Diagnosis not present

## 2016-09-02 DIAGNOSIS — Z794 Long term (current) use of insulin: Secondary | ICD-10-CM | POA: Diagnosis not present

## 2016-09-02 DIAGNOSIS — Z7409 Other reduced mobility: Secondary | ICD-10-CM | POA: Diagnosis not present

## 2016-09-02 DIAGNOSIS — Z7952 Long term (current) use of systemic steroids: Secondary | ICD-10-CM | POA: Diagnosis not present

## 2016-09-02 DIAGNOSIS — I70234 Atherosclerosis of native arteries of right leg with ulceration of heel and midfoot: Secondary | ICD-10-CM | POA: Diagnosis not present

## 2016-09-02 DIAGNOSIS — I739 Peripheral vascular disease, unspecified: Secondary | ICD-10-CM | POA: Diagnosis not present

## 2016-09-02 DIAGNOSIS — D62 Acute posthemorrhagic anemia: Secondary | ICD-10-CM | POA: Diagnosis not present

## 2016-09-02 DIAGNOSIS — Z5181 Encounter for therapeutic drug level monitoring: Secondary | ICD-10-CM | POA: Diagnosis not present

## 2016-09-02 DIAGNOSIS — I70201 Unspecified atherosclerosis of native arteries of extremities, right leg: Secondary | ICD-10-CM | POA: Diagnosis present

## 2016-09-02 DIAGNOSIS — L03115 Cellulitis of right lower limb: Secondary | ICD-10-CM | POA: Diagnosis present

## 2016-09-02 DIAGNOSIS — M86171 Other acute osteomyelitis, right ankle and foot: Secondary | ICD-10-CM | POA: Diagnosis present

## 2016-09-02 DIAGNOSIS — R419 Unspecified symptoms and signs involving cognitive functions and awareness: Secondary | ICD-10-CM | POA: Diagnosis not present

## 2016-09-02 DIAGNOSIS — L97519 Non-pressure chronic ulcer of other part of right foot with unspecified severity: Secondary | ICD-10-CM | POA: Diagnosis not present

## 2016-09-02 DIAGNOSIS — N179 Acute kidney failure, unspecified: Secondary | ICD-10-CM | POA: Diagnosis present

## 2016-09-02 DIAGNOSIS — I110 Hypertensive heart disease with heart failure: Secondary | ICD-10-CM | POA: Diagnosis not present

## 2016-09-02 DIAGNOSIS — L97419 Non-pressure chronic ulcer of right heel and midfoot with unspecified severity: Secondary | ICD-10-CM | POA: Diagnosis not present

## 2016-09-02 DIAGNOSIS — Z792 Long term (current) use of antibiotics: Secondary | ICD-10-CM | POA: Diagnosis not present

## 2016-09-02 DIAGNOSIS — H5213 Myopia, bilateral: Secondary | ICD-10-CM | POA: Diagnosis present

## 2016-09-13 DIAGNOSIS — N185 Chronic kidney disease, stage 5: Secondary | ICD-10-CM | POA: Diagnosis not present

## 2016-09-13 DIAGNOSIS — D631 Anemia in chronic kidney disease: Secondary | ICD-10-CM | POA: Diagnosis not present

## 2016-09-13 DIAGNOSIS — I120 Hypertensive chronic kidney disease with stage 5 chronic kidney disease or end stage renal disease: Secondary | ICD-10-CM | POA: Diagnosis not present

## 2016-09-13 DIAGNOSIS — N2581 Secondary hyperparathyroidism of renal origin: Secondary | ICD-10-CM | POA: Diagnosis not present

## 2016-09-13 DIAGNOSIS — E1122 Type 2 diabetes mellitus with diabetic chronic kidney disease: Secondary | ICD-10-CM | POA: Diagnosis not present

## 2016-09-13 DIAGNOSIS — Z940 Kidney transplant status: Secondary | ICD-10-CM | POA: Diagnosis not present

## 2016-09-23 DIAGNOSIS — Z4789 Encounter for other orthopedic aftercare: Secondary | ICD-10-CM | POA: Diagnosis not present

## 2016-09-23 DIAGNOSIS — D899 Disorder involving the immune mechanism, unspecified: Secondary | ICD-10-CM | POA: Diagnosis not present

## 2016-09-23 DIAGNOSIS — Z940 Kidney transplant status: Secondary | ICD-10-CM | POA: Diagnosis not present

## 2016-09-23 DIAGNOSIS — I132 Hypertensive heart and chronic kidney disease with heart failure and with stage 5 chronic kidney disease, or end stage renal disease: Secondary | ICD-10-CM | POA: Diagnosis not present

## 2016-09-23 DIAGNOSIS — Z9641 Presence of insulin pump (external) (internal): Secondary | ICD-10-CM | POA: Diagnosis not present

## 2016-09-23 DIAGNOSIS — E1022 Type 1 diabetes mellitus with diabetic chronic kidney disease: Secondary | ICD-10-CM | POA: Diagnosis not present

## 2016-09-23 DIAGNOSIS — I509 Heart failure, unspecified: Secondary | ICD-10-CM | POA: Diagnosis not present

## 2016-09-23 DIAGNOSIS — Z9889 Other specified postprocedural states: Secondary | ICD-10-CM | POA: Diagnosis not present

## 2016-09-23 DIAGNOSIS — Z888 Allergy status to other drugs, medicaments and biological substances status: Secondary | ICD-10-CM | POA: Diagnosis not present

## 2016-09-23 DIAGNOSIS — D631 Anemia in chronic kidney disease: Secondary | ICD-10-CM | POA: Diagnosis not present

## 2016-09-23 DIAGNOSIS — M8610 Other acute osteomyelitis, unspecified site: Secondary | ICD-10-CM | POA: Diagnosis not present

## 2016-09-23 DIAGNOSIS — E1069 Type 1 diabetes mellitus with other specified complication: Secondary | ICD-10-CM | POA: Diagnosis not present

## 2016-09-23 DIAGNOSIS — E875 Hyperkalemia: Secondary | ICD-10-CM | POA: Diagnosis not present

## 2016-09-23 DIAGNOSIS — Z9483 Pancreas transplant status: Secondary | ICD-10-CM | POA: Diagnosis not present

## 2016-09-23 DIAGNOSIS — Z7982 Long term (current) use of aspirin: Secondary | ICD-10-CM | POA: Diagnosis not present

## 2016-09-23 DIAGNOSIS — Z79899 Other long term (current) drug therapy: Secondary | ICD-10-CM | POA: Diagnosis not present

## 2016-09-23 DIAGNOSIS — Z792 Long term (current) use of antibiotics: Secondary | ICD-10-CM | POA: Diagnosis not present

## 2016-09-23 DIAGNOSIS — Z794 Long term (current) use of insulin: Secondary | ICD-10-CM | POA: Diagnosis not present

## 2016-09-23 DIAGNOSIS — Z89511 Acquired absence of right leg below knee: Secondary | ICD-10-CM | POA: Diagnosis not present

## 2016-09-23 DIAGNOSIS — E10621 Type 1 diabetes mellitus with foot ulcer: Secondary | ICD-10-CM | POA: Diagnosis not present

## 2016-09-23 DIAGNOSIS — N186 End stage renal disease: Secondary | ICD-10-CM | POA: Diagnosis not present

## 2016-09-23 DIAGNOSIS — L97519 Non-pressure chronic ulcer of other part of right foot with unspecified severity: Secondary | ICD-10-CM | POA: Diagnosis not present

## 2016-09-23 DIAGNOSIS — N319 Neuromuscular dysfunction of bladder, unspecified: Secondary | ICD-10-CM | POA: Diagnosis not present

## 2016-09-23 DIAGNOSIS — E109 Type 1 diabetes mellitus without complications: Secondary | ICD-10-CM | POA: Diagnosis not present

## 2016-09-23 DIAGNOSIS — D8989 Other specified disorders involving the immune mechanism, not elsewhere classified: Secondary | ICD-10-CM | POA: Diagnosis not present

## 2016-09-27 DIAGNOSIS — Z940 Kidney transplant status: Secondary | ICD-10-CM | POA: Diagnosis not present

## 2016-09-28 DIAGNOSIS — Z4781 Encounter for orthopedic aftercare following surgical amputation: Secondary | ICD-10-CM | POA: Diagnosis not present

## 2016-09-28 DIAGNOSIS — Z8631 Personal history of diabetic foot ulcer: Secondary | ICD-10-CM | POA: Diagnosis not present

## 2016-09-28 DIAGNOSIS — E1069 Type 1 diabetes mellitus with other specified complication: Secondary | ICD-10-CM | POA: Diagnosis not present

## 2016-09-28 DIAGNOSIS — Z89511 Acquired absence of right leg below knee: Secondary | ICD-10-CM | POA: Diagnosis not present

## 2016-09-28 DIAGNOSIS — R262 Difficulty in walking, not elsewhere classified: Secondary | ICD-10-CM | POA: Diagnosis not present

## 2016-09-28 DIAGNOSIS — M6281 Muscle weakness (generalized): Secondary | ICD-10-CM | POA: Diagnosis not present

## 2016-09-30 DIAGNOSIS — Z4781 Encounter for orthopedic aftercare following surgical amputation: Secondary | ICD-10-CM | POA: Diagnosis not present

## 2016-09-30 DIAGNOSIS — M6281 Muscle weakness (generalized): Secondary | ICD-10-CM | POA: Diagnosis not present

## 2016-09-30 DIAGNOSIS — Z89511 Acquired absence of right leg below knee: Secondary | ICD-10-CM | POA: Diagnosis not present

## 2016-09-30 DIAGNOSIS — E1069 Type 1 diabetes mellitus with other specified complication: Secondary | ICD-10-CM | POA: Diagnosis not present

## 2016-09-30 DIAGNOSIS — Z8631 Personal history of diabetic foot ulcer: Secondary | ICD-10-CM | POA: Diagnosis not present

## 2016-09-30 DIAGNOSIS — R262 Difficulty in walking, not elsewhere classified: Secondary | ICD-10-CM | POA: Diagnosis not present

## 2016-10-07 DIAGNOSIS — Z8631 Personal history of diabetic foot ulcer: Secondary | ICD-10-CM | POA: Diagnosis not present

## 2016-10-07 DIAGNOSIS — Z4781 Encounter for orthopedic aftercare following surgical amputation: Secondary | ICD-10-CM | POA: Diagnosis not present

## 2016-10-07 DIAGNOSIS — R262 Difficulty in walking, not elsewhere classified: Secondary | ICD-10-CM | POA: Diagnosis not present

## 2016-10-07 DIAGNOSIS — Z89511 Acquired absence of right leg below knee: Secondary | ICD-10-CM | POA: Diagnosis not present

## 2016-10-07 DIAGNOSIS — M6281 Muscle weakness (generalized): Secondary | ICD-10-CM | POA: Diagnosis not present

## 2016-10-07 DIAGNOSIS — E1069 Type 1 diabetes mellitus with other specified complication: Secondary | ICD-10-CM | POA: Diagnosis not present

## 2016-10-10 DIAGNOSIS — Z4781 Encounter for orthopedic aftercare following surgical amputation: Secondary | ICD-10-CM | POA: Diagnosis not present

## 2016-10-10 DIAGNOSIS — E1069 Type 1 diabetes mellitus with other specified complication: Secondary | ICD-10-CM | POA: Diagnosis not present

## 2016-10-10 DIAGNOSIS — Z89511 Acquired absence of right leg below knee: Secondary | ICD-10-CM | POA: Diagnosis not present

## 2016-10-10 DIAGNOSIS — M6281 Muscle weakness (generalized): Secondary | ICD-10-CM | POA: Diagnosis not present

## 2016-10-10 DIAGNOSIS — Z8631 Personal history of diabetic foot ulcer: Secondary | ICD-10-CM | POA: Diagnosis not present

## 2016-10-10 DIAGNOSIS — R262 Difficulty in walking, not elsewhere classified: Secondary | ICD-10-CM | POA: Diagnosis not present

## 2016-10-11 DIAGNOSIS — Z89511 Acquired absence of right leg below knee: Secondary | ICD-10-CM | POA: Diagnosis not present

## 2016-10-11 DIAGNOSIS — Z4781 Encounter for orthopedic aftercare following surgical amputation: Secondary | ICD-10-CM | POA: Diagnosis not present

## 2016-10-13 DIAGNOSIS — Z89511 Acquired absence of right leg below knee: Secondary | ICD-10-CM | POA: Diagnosis not present

## 2016-10-13 DIAGNOSIS — Z8631 Personal history of diabetic foot ulcer: Secondary | ICD-10-CM | POA: Diagnosis not present

## 2016-10-13 DIAGNOSIS — M6281 Muscle weakness (generalized): Secondary | ICD-10-CM | POA: Diagnosis not present

## 2016-10-13 DIAGNOSIS — E1069 Type 1 diabetes mellitus with other specified complication: Secondary | ICD-10-CM | POA: Diagnosis not present

## 2016-10-13 DIAGNOSIS — Z4781 Encounter for orthopedic aftercare following surgical amputation: Secondary | ICD-10-CM | POA: Diagnosis not present

## 2016-10-13 DIAGNOSIS — R262 Difficulty in walking, not elsewhere classified: Secondary | ICD-10-CM | POA: Diagnosis not present

## 2016-10-18 DIAGNOSIS — Z4781 Encounter for orthopedic aftercare following surgical amputation: Secondary | ICD-10-CM | POA: Diagnosis not present

## 2016-10-18 DIAGNOSIS — E1069 Type 1 diabetes mellitus with other specified complication: Secondary | ICD-10-CM | POA: Diagnosis not present

## 2016-10-18 DIAGNOSIS — M6281 Muscle weakness (generalized): Secondary | ICD-10-CM | POA: Diagnosis not present

## 2016-10-18 DIAGNOSIS — R262 Difficulty in walking, not elsewhere classified: Secondary | ICD-10-CM | POA: Diagnosis not present

## 2016-10-18 DIAGNOSIS — Z8631 Personal history of diabetic foot ulcer: Secondary | ICD-10-CM | POA: Diagnosis not present

## 2016-10-18 DIAGNOSIS — Z89511 Acquired absence of right leg below knee: Secondary | ICD-10-CM | POA: Diagnosis not present

## 2016-10-21 DIAGNOSIS — E875 Hyperkalemia: Secondary | ICD-10-CM | POA: Diagnosis not present

## 2016-10-21 DIAGNOSIS — Z792 Long term (current) use of antibiotics: Secondary | ICD-10-CM | POA: Diagnosis not present

## 2016-10-21 DIAGNOSIS — M8610 Other acute osteomyelitis, unspecified site: Secondary | ICD-10-CM | POA: Diagnosis not present

## 2016-10-21 DIAGNOSIS — Z4781 Encounter for orthopedic aftercare following surgical amputation: Secondary | ICD-10-CM | POA: Diagnosis not present

## 2016-10-21 DIAGNOSIS — E108 Type 1 diabetes mellitus with unspecified complications: Secondary | ICD-10-CM | POA: Diagnosis not present

## 2016-10-21 DIAGNOSIS — Z794 Long term (current) use of insulin: Secondary | ICD-10-CM | POA: Diagnosis not present

## 2016-10-21 DIAGNOSIS — M6281 Muscle weakness (generalized): Secondary | ICD-10-CM | POA: Diagnosis not present

## 2016-10-21 DIAGNOSIS — I10 Essential (primary) hypertension: Secondary | ICD-10-CM | POA: Diagnosis not present

## 2016-10-21 DIAGNOSIS — Z9483 Pancreas transplant status: Secondary | ICD-10-CM | POA: Diagnosis not present

## 2016-10-21 DIAGNOSIS — Z79899 Other long term (current) drug therapy: Secondary | ICD-10-CM | POA: Diagnosis not present

## 2016-10-21 DIAGNOSIS — E109 Type 1 diabetes mellitus without complications: Secondary | ICD-10-CM | POA: Diagnosis not present

## 2016-10-21 DIAGNOSIS — Z23 Encounter for immunization: Secondary | ICD-10-CM | POA: Diagnosis not present

## 2016-10-21 DIAGNOSIS — Z89511 Acquired absence of right leg below knee: Secondary | ICD-10-CM | POA: Diagnosis not present

## 2016-10-21 DIAGNOSIS — Z9641 Presence of insulin pump (external) (internal): Secondary | ICD-10-CM | POA: Diagnosis not present

## 2016-10-21 DIAGNOSIS — E1069 Type 1 diabetes mellitus with other specified complication: Secondary | ICD-10-CM | POA: Diagnosis not present

## 2016-10-21 DIAGNOSIS — Z940 Kidney transplant status: Secondary | ICD-10-CM | POA: Diagnosis not present

## 2016-10-21 DIAGNOSIS — T86891 Other transplanted tissue failure: Secondary | ICD-10-CM | POA: Diagnosis not present

## 2016-10-21 DIAGNOSIS — Z4822 Encounter for aftercare following kidney transplant: Secondary | ICD-10-CM | POA: Diagnosis not present

## 2016-10-21 DIAGNOSIS — D8989 Other specified disorders involving the immune mechanism, not elsewhere classified: Secondary | ICD-10-CM | POA: Diagnosis not present

## 2016-10-21 DIAGNOSIS — R262 Difficulty in walking, not elsewhere classified: Secondary | ICD-10-CM | POA: Diagnosis not present

## 2016-10-21 DIAGNOSIS — Z8631 Personal history of diabetic foot ulcer: Secondary | ICD-10-CM | POA: Diagnosis not present

## 2016-10-21 DIAGNOSIS — L97519 Non-pressure chronic ulcer of other part of right foot with unspecified severity: Secondary | ICD-10-CM | POA: Diagnosis not present

## 2016-10-21 DIAGNOSIS — N319 Neuromuscular dysfunction of bladder, unspecified: Secondary | ICD-10-CM | POA: Diagnosis not present

## 2016-10-25 DIAGNOSIS — Z89511 Acquired absence of right leg below knee: Secondary | ICD-10-CM | POA: Diagnosis not present

## 2016-10-25 DIAGNOSIS — M6281 Muscle weakness (generalized): Secondary | ICD-10-CM | POA: Diagnosis not present

## 2016-10-25 DIAGNOSIS — E1069 Type 1 diabetes mellitus with other specified complication: Secondary | ICD-10-CM | POA: Diagnosis not present

## 2016-10-25 DIAGNOSIS — Z4781 Encounter for orthopedic aftercare following surgical amputation: Secondary | ICD-10-CM | POA: Diagnosis not present

## 2016-10-25 DIAGNOSIS — Z8631 Personal history of diabetic foot ulcer: Secondary | ICD-10-CM | POA: Diagnosis not present

## 2016-10-25 DIAGNOSIS — R262 Difficulty in walking, not elsewhere classified: Secondary | ICD-10-CM | POA: Diagnosis not present

## 2016-10-28 DIAGNOSIS — Z4781 Encounter for orthopedic aftercare following surgical amputation: Secondary | ICD-10-CM | POA: Diagnosis not present

## 2016-10-28 DIAGNOSIS — E1069 Type 1 diabetes mellitus with other specified complication: Secondary | ICD-10-CM | POA: Diagnosis not present

## 2016-10-28 DIAGNOSIS — M6281 Muscle weakness (generalized): Secondary | ICD-10-CM | POA: Diagnosis not present

## 2016-10-28 DIAGNOSIS — Z8631 Personal history of diabetic foot ulcer: Secondary | ICD-10-CM | POA: Diagnosis not present

## 2016-10-28 DIAGNOSIS — Z89511 Acquired absence of right leg below knee: Secondary | ICD-10-CM | POA: Diagnosis not present

## 2016-10-28 DIAGNOSIS — R262 Difficulty in walking, not elsewhere classified: Secondary | ICD-10-CM | POA: Diagnosis not present

## 2016-11-02 DIAGNOSIS — R262 Difficulty in walking, not elsewhere classified: Secondary | ICD-10-CM | POA: Diagnosis not present

## 2016-11-02 DIAGNOSIS — Z89511 Acquired absence of right leg below knee: Secondary | ICD-10-CM | POA: Diagnosis not present

## 2016-11-02 DIAGNOSIS — Z8631 Personal history of diabetic foot ulcer: Secondary | ICD-10-CM | POA: Diagnosis not present

## 2016-11-02 DIAGNOSIS — E1069 Type 1 diabetes mellitus with other specified complication: Secondary | ICD-10-CM | POA: Diagnosis not present

## 2016-11-02 DIAGNOSIS — M6281 Muscle weakness (generalized): Secondary | ICD-10-CM | POA: Diagnosis not present

## 2016-11-02 DIAGNOSIS — Z4781 Encounter for orthopedic aftercare following surgical amputation: Secondary | ICD-10-CM | POA: Diagnosis not present

## 2016-11-08 DIAGNOSIS — Z89511 Acquired absence of right leg below knee: Secondary | ICD-10-CM | POA: Diagnosis not present

## 2016-11-22 DIAGNOSIS — Z940 Kidney transplant status: Secondary | ICD-10-CM | POA: Diagnosis not present

## 2016-11-22 DIAGNOSIS — E1021 Type 1 diabetes mellitus with diabetic nephropathy: Secondary | ICD-10-CM | POA: Diagnosis not present

## 2016-11-22 DIAGNOSIS — Z89511 Acquired absence of right leg below knee: Secondary | ICD-10-CM | POA: Diagnosis not present

## 2016-11-22 DIAGNOSIS — E10319 Type 1 diabetes mellitus with unspecified diabetic retinopathy without macular edema: Secondary | ICD-10-CM | POA: Diagnosis not present

## 2016-11-22 DIAGNOSIS — E1042 Type 1 diabetes mellitus with diabetic polyneuropathy: Secondary | ICD-10-CM | POA: Diagnosis not present

## 2017-01-04 DIAGNOSIS — Z940 Kidney transplant status: Secondary | ICD-10-CM | POA: Diagnosis not present

## 2017-01-04 DIAGNOSIS — N185 Chronic kidney disease, stage 5: Secondary | ICD-10-CM | POA: Diagnosis not present

## 2017-01-04 DIAGNOSIS — I120 Hypertensive chronic kidney disease with stage 5 chronic kidney disease or end stage renal disease: Secondary | ICD-10-CM | POA: Diagnosis not present

## 2017-01-04 DIAGNOSIS — N2581 Secondary hyperparathyroidism of renal origin: Secondary | ICD-10-CM | POA: Diagnosis not present

## 2017-01-04 DIAGNOSIS — D631 Anemia in chronic kidney disease: Secondary | ICD-10-CM | POA: Diagnosis not present

## 2017-01-04 DIAGNOSIS — E1122 Type 2 diabetes mellitus with diabetic chronic kidney disease: Secondary | ICD-10-CM | POA: Diagnosis not present

## 2017-01-17 ENCOUNTER — Encounter: Payer: Self-pay | Admitting: Physical Therapy

## 2017-01-17 ENCOUNTER — Ambulatory Visit: Payer: Medicare Other | Attending: Surgical | Admitting: Physical Therapy

## 2017-01-17 DIAGNOSIS — M21372 Foot drop, left foot: Secondary | ICD-10-CM | POA: Insufficient documentation

## 2017-01-17 DIAGNOSIS — R2681 Unsteadiness on feet: Secondary | ICD-10-CM | POA: Insufficient documentation

## 2017-01-17 DIAGNOSIS — R2689 Other abnormalities of gait and mobility: Secondary | ICD-10-CM | POA: Insufficient documentation

## 2017-01-17 DIAGNOSIS — M6281 Muscle weakness (generalized): Secondary | ICD-10-CM | POA: Diagnosis not present

## 2017-01-18 NOTE — Therapy (Signed)
[name], [name], [address] Phone: [phone]   Fax:  [phone]  Physical Therapy Evaluation  Patient Details  Name: Tony Fisher MRN: [id number] Date of Birth: October 27, 1970 Referring Provider: Reggie Pile, PA   Encounter Date: 01/17/2017  PT End of Session - 01/17/17 1846    Visit Number  1    Number of Visits  10    Date for PT Re-Evaluation  03/17/17    Authorization Type  Medicare G-Code & progress notes    PT Start Time  1445    PT Stop Time  1533    PT Time Calculation (min)  48 min    Equipment Utilized During Treatment  Gait belt    Activity Tolerance  Patient tolerated treatment well;Patient limited by fatigue    Behavior During Therapy  [facility] for tasks assessed/performed       Past Medical History:  Diagnosis Date  . Diabetes mellitus     Past Surgical History:  Procedure Laterality Date  . KIDNEY TRANSPLANT  2006  . TRANSPLANT PANCREATIC ALLOGRAFT  2006    There were no vitals filed for this visit.   Subjective Assessment - 01/17/17 1454    Subjective  This 46yo male was referred to Physical Therapy on 12/14/2016 with right Transtibial Ampuation. He underwent right Transtibial Amputation on 09/07/2016. He recieved his prosthesis on 12/15/2016.     Pertinent History  Rt TTA, kidney transplant, HTN, PAD, DM1, neuropathy, sclerosis of B eyes, depression, failed prancreas transplant,     Patient Stated Goals  strength & stamina back to return to travel, carry groceries to 2nd floor condo, wife diagnosed with breast cancer & chemo within 1 week of amputation & he wants to help.     Currently in Pain?  No/denies         [facility] PT Assessment - 01/17/17 1445      Assessment   Medical Diagnosis  Right Transtibial Amputation    Referring Provider  Reggie Pile, [place]    Onset Date/Surgical Date  12/15/16 prosthesis delivery    Hand Dominance  Right      Precautions   Precautions  Fall      Balance Screen   Has the patient fallen in the past 6 months  Yes    How many times?  3 amputation prior to get prosthesis, no injuries    Has the patient had a decrease in activity level because of a fear of falling?   No    Is the patient reluctant to leave their home because of a fear of falling?   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 Left ankle dorsiflexion -8*      Strength   Overall Strength  Deficits    Strength Assessment Site  Hip;Knee;Ankle    Right/Left Hip  Right;Left    Right Hip Flexion  5/5    Right Hip Extension  4/5    Right Hip ABduction  4/5    Left Hip Flexion  4/5    Left Hip Extension  3/5    Left Hip ABduction  3/5    Right/Left Knee  Right;Left    Right Knee Flexion  4/5    Right Knee Extension  5/5    Left Knee Flexion  3+/5    Left Knee Extension  4/5    Right/Left Ankle  Right    Right Ankle Dorsiflexion  3-/5    Right Ankle Plantar Flexion  3-/5      Transfers   Transfers  Sit to Stand;Stand to Sit    Sit to Stand  5: Supervision;Without  upper extremity assist;From chair/3-in-1    Stand to Sit  5: Supervision;With upper extremity assist;With armrests;To chair/3-in-1      Ambulation/Gait   Ambulation/Gait  Yes    Ambulation/Gait Assistance  5: Supervision    Ambulation/Gait Assistance Details  Pt was issued a double upright AFO at same time as prosthesis. However he felt AFO was heavy & akward so he removed it from the shoe. He does ambulate with steppage gait due to left foot drop. He also has difficulty maintaining path with inconsistent foot placement between scissoring/adducting & abducting    Ambulation Distance (Feet)  250 Feet    Assistive device  Prosthesis;None    Gait Pattern  Step-through pattern;Decreased arm swing - right;Decreased step length - left;Decreased stance time - right;Decreased stride length;Decreased weight shift to right;Right circumduction;Right hip hike;Left hip hike;Left steppage;Left foot flat;Right genu recurvatum;Left genu recurvatum;Antalgic;Lateral hip instability;Poor foot clearance - left    Ambulation Surface  Indoor;Level    Gait velocity  3.04 ft/sec    Stairs  Yes    Stairs Assistance  5: Supervision    Stair Management Technique  Two rails;Step to pattern;Forwards    Number of Stairs  4      Standardized Balance Assessment   Standardized Balance Assessment  Berg Balance Test      Berg Balance Test   Sit to Stand  Able to stand without using hands and stabilize independently    Standing Unsupported  Able to stand 30 seconds unsupported    Sitting with Back Unsupported but Feet Supported on Floor or Stool  Able to sit safely and securely 2 minutes    Stand to Sit  Controls descent by using hands    Transfers  Able to transfer safely, minor use of hands    Standing Unsupported with Eyes Closed  Unable to keep eyes closed 3 seconds but stays steady    Standing Ubsupported with Feet Together  Needs help to attain position and unable to hold for 15 seconds    From Standing, Reach Forward  with Outstretched Arm  Reaches forward but needs supervision    From Standing Position, Pick up Object from Floor  Able to pick up shoe, needs  supervision    From Standing Position, Turn to Look Behind Over each Shoulder  Needs supervision when turning    Turn 360 Degrees  Needs close supervision or verbal cueing    Standing Unsupported, Alternately Place Feet on Step/Stool  Able to complete >2 steps/needs minimal assist    Standing Unsupported, One Foot in [place] help to step but can hold 15 seconds    Standing on One Leg  Tries to lift leg/unable to hold 3 seconds but remains standing independently    Total Score  27      Functional Gait  Assessment   Gait assessed   Yes    Gait Level Surface  Walks 20 ft in less than 7 sec but greater than 5.5 sec, uses assistive device, slower speed, mild gait deviations, or deviates 6-10 in outside of the 12 in walkway width.    Change in Gait Speed  Able to change speed, demonstrates mild gait deviations, deviates 6-10 in outside of the 12 in walkway width, or no gait deviations, unable to achieve a major change in velocity, or uses a change in velocity, or uses an assistive device.    Gait with Horizontal Head Turns  Performs head turns with moderate changes in gait velocity, slows down, deviates 10-15 in outside 12 in walkway width but recovers, can continue to walk.    Gait with Vertical Head Turns  Performs task with moderate change in gait velocity, slows down, deviates 10-15 in outside 12 in walkway width but recovers, can continue to walk.    Gait and Pivot Turn  Turns slowly, requires verbal cueing, or requires several small steps to catch balance following turn and stop    Step Over Obstacle  Cannot perform without assistance.    Gait with Narrow Base of Support  Ambulates less than 4 steps heel to toe or cannot perform without assistance.    Gait with Eyes Closed  Cannot walk 20 ft without assistance, severe gait deviations or imbalance,  deviates greater than 15 in outside 12 in walkway width or will not attempt task.    Ambulating Backwards  Cannot walk 20 ft without assistance, severe gait deviations or imbalance, deviates greater than 15 in outside 12 in walkway width or will not attempt task.    Steps  Two feet to a stair, must use rail.    Total Score  8      Prosthetics Assessment - 01/17/17 Daisy with  Prosthetic cleaning;Ply sock cleaning    Prosthetic Care Dependent with  Skin check;Residual limb care;Correct ply sock adjustment;Proper wear schedule/adjustment;Proper weight-bearing schedule/adjustment    Donning prosthesis   Supervision    Current prosthetic wear tolerance (days/week)   daily except He removed prosthesis completely for 3 day 11/29-12/1 due to wound.     Current prosthetic wear tolerance (#hours/day)   initially 1hr 2-3 times per day for first week, then went to most of day until he got wound at distal limb on 11/29, After removing for 3 days he slowly built up to 4-5hrs 1x/day as of yesterday, 12/17.    PT recommended wear 4hrs 2x/day.     Current prosthetic weight-bearing tolerance (hours/day)   Pt tolerated standing & gait for 29min without pain but required seated rest due to fatigue.     Edema  none    Residual limb condition   distal limb has healing blister 1cm diameter with superficial  depth. Red circular areas around follicles indicating folliculitis. scar is healed & not adhered. normal hair growth & temperature.     K code/activity level with prosthetic use   K3 ful community with variable cadence             Objective measurements completed on examination: See above findings.      [facility] Adult PT Treatment/Exercise - 01/17/17 1445      Prosthetics   Education Provided  Skin check;Residual limb care;Proper Donning;Proper wear schedule/adjustment;Proper weight-bearing schedule/adjustment    Person(s) Educated  Patient    Education Method   Explanation;Verbal cues    Education Method  Verbalized understanding;Verbal cues required;Needs further instruction               PT Short Term Goals - 01/17/17 1821      PT SHORT TERM GOAL #1   Title  Patient verbalizes proper wear adjustment & skin modifications. (All STGs Target Date: 02/17/2017)    Time  4    Period  Weeks    Status  New    Target Date  02/17/17      PT SHORT TERM GOAL #2   Title  Patient tolerates wear daily >10 hrs total /day without increased skin issues or limb pain.     Time  4    Period  Weeks    Status  New    Target Date  02/17/17      PT SHORT TERM GOAL #3   Title  Patient picks up items from floor & reaches 5" without balance loss.     Time  4    Period  Weeks    Status  New    Target Date  02/17/17      PT SHORT TERM GOAL #4   Title  Patient ambulates 500' with prosthesis with supervision/ verbal cues for deviations.     Time  4    Period  Weeks    Status  New    Target Date  02/17/17      PT SHORT TERM GOAL #5   Title  patient negotiates ramps, curbs & stairs (1 rail) with prosthesis with supervision.     Time  4    Period  Weeks    Target Date  02/17/17        PT Long Term Goals - 01/17/17 1814      PT LONG TERM GOAL #1   Title  Patient verbalizes & demonstrates proper prosthetic care to enable safe use of prosthesis. (All LTGs Target Date: 03/17/2017)    Time  8    Period  Weeks    Status  New    Target Date  03/17/17      PT LONG TERM GOAL #2   Title  Patient tolerates wear of prosthesis daily >90% of awake hours without skin issues or limb pain to enable function throughout his day.     Time  8    Period  Weeks    Status  New    Target Date  03/17/17      PT LONG TERM GOAL #3   Title  Berg Balance >36/56 to indicate lower fall risk.     Time  8    Period  Weeks    Status  New    Target Date  03/17/17      PT LONG TERM GOAL #4   Title  Functional Gait Assessment >/= 19/30 to indicate lower fall risk.  Time  8    Period  Weeks    Target Date  03/17/17      PT LONG TERM GOAL #5   Title  Patient ambulates 1000' with prosthesis including grass, ramps & curbs modified independent for community access.     Time  8    Period  Weeks    Status  New    Target Date  03/17/17      Additional Long Term Goals   Additional Long Term Goals  Yes      PT LONG TERM GOAL #6   Title  Patient able to lift & carry 20# & carry items on stairs similar to carrying groceries into his condo to meet his goal.     Time  8    Period  Weeks    Status  New    Target Date  03/17/17             Plan - 01/17/17 1853    Clinical Impression Statement  This 46yo male with Type 1 DM underwent a right Transtibial Amputation due to recurrent wound that was infected on 09/07/2016. He recieved his first prosthesis 12/15/2016 and progressed wear too rapidly creating a wound on distal limb. He held wear for 3 days and has built up to daily wear up to 4hrs. His residual limb has skin changes typical with heat rash. He is dependent in proper use & care of porsthesis limiting safe use. Patient needs to progress wear to most of awake hours to enable function throughout his day. Berg Balance 27/56 indicates high fall risk. Patient has a great deal of difficulty with stationery standing ADLs without UE support. Patient ambulates without device except prosthesis with close supervision for straight path, focused gait and minA for negotiating around obstacles & scanning environment. Functional Gait Assessment 8/30 indicates high fall risk. Patient's goals are to resume acitivities including lifting & carrying.     History and Personal Factors relevant to plan of care:  Patient's wife was diagnosed with breast cancer within 1 week of his amputation and is undergoing chemotherapy. They live in 2nd floor condo with family nearby. Patient had kidney/pancreas transplant that failed and underwent a 2nd kidney transplant 2 years ago. He is  immunosuppressed.     Clinical Presentation  Evolving    Clinical Presentation due to:  wife with breast cancer, weakness & balance deficits, DM1 with neuropathy, immunosuppressed s/p kidney transplant    Clinical Decision Making  Moderate    Rehab Potential  Good    Clinical Impairments Affecting Rehab Potential  Rt TTA, 2nd kidney transplant, HTN, PAD, DM1, neuropathy, sclerosis of B eyes, depression, failed kidney/ prancreas transplant,    PT Frequency  1x / week    PT Duration  8 weeks    PT Treatment/Interventions  ADLs/Self Care Home Management;Gait training;Stair training;Functional mobility training;Therapeutic activities;Balance training;Therapeutic exercise;DME Instruction;Neuromuscular re-education;Patient/family education;Orthotic Fit/Training;Prosthetic Training;Passive range of motion    PT Next Visit Plan  HEP at sink for midline, review prosthetic care, prosthetic gait    Recommended Other Services  pt recieved double upright metal AFO but pt discontinued use due to heavy & akward, he may need different AFO due to foot drop but concern is skin integrity with DM    Consulted and Agree with Plan of Care  Patient       Patient will benefit from skilled therapeutic intervention in order to improve the following deficits and impairments:  Abnormal gait, Decreased activity tolerance, Decreased  balance, Decreased endurance, Decreased coordination, Decreased knowledge of use of DME, Decreased mobility, Decreased strength, Impaired flexibility, Postural dysfunction, Prosthetic Dependency  Visit Diagnosis: Other abnormalities of gait and mobility  Foot drop, left  Muscle weakness (generalized)  Unsteadiness on feet  G-Codes - 01-31-2017 1813    Functional Assessment Tool Used (Outpatient Only)  Patient is dependent in prosthetic care. He wears prosthesis 4hrs 1x/day. He has wound on distal limb & heat rash.     Functional Limitation  Self care    Self Care Current Status [id number])  At  least 60 percent but less than 80 percent impaired, limited or restricted    Self Care Goal Status ([id number])  At least 1 percent but less than 20 percent impaired, limited or restricted        Problem List There are no active problems to display for this patient.   Jamey Reas PT, DPT 01/18/2017, 1:33 PM  [place] [address] [place], [name], [address] Phone: [phone]   Fax:  [phone]  Name: Tony Fisher MRN: [id number] Date of Birth: September 12, 1970

## 2017-01-25 ENCOUNTER — Encounter: Payer: Self-pay | Admitting: Physical Therapy

## 2017-01-25 ENCOUNTER — Ambulatory Visit: Payer: Medicare Other | Admitting: Physical Therapy

## 2017-01-25 DIAGNOSIS — R2689 Other abnormalities of gait and mobility: Secondary | ICD-10-CM | POA: Diagnosis not present

## 2017-01-25 DIAGNOSIS — M6281 Muscle weakness (generalized): Secondary | ICD-10-CM

## 2017-01-25 DIAGNOSIS — M21372 Foot drop, left foot: Secondary | ICD-10-CM

## 2017-01-25 DIAGNOSIS — R2681 Unsteadiness on feet: Secondary | ICD-10-CM | POA: Diagnosis not present

## 2017-01-25 NOTE — Patient Instructions (Signed)
Do each exercise 1-2  times per day Do each exercise 5-10 repetitions Hold each exercise for 2-3 seconds to feel your location  AT [place].  USE TAPE ON FLOOR TO MARK THE MIDLINE POSITION. You also should try to feel with your limb pressure in socket.  You are trying to feel with limb what you used to feel with the bottom of your foot.  1. Side to Side Shift: Moving your hips only (not shoulders): move weight onto your left leg, HOLD/FEEL.  Move back to equal weight on each leg, HOLD/FEEL. Move weight onto your right leg, HOLD/FEEL. Move back to equal weight on each leg, HOLD/FEEL. Repeat. 2. Front to Back Shift: Moving your hips only (not shoulders): move your weight forward onto your toes, HOLD/FEEL. Move your weight back to equal Flat Foot on both legs, HOLD/FEEL. Move your weight back onto your heels, HOLD/FEEL. Move your weight back to equal on both legs, HOLD/FEEL. Repeat. 3. Moving Cones / Cups: With equal weight on each leg: Hold on with one hand the first time, then progress to no hand supports. Move cups from one side of sink to the other. Place cups ~2" out of your reach, progress to 10" beyond reach. 4. Overhead/Upward Reaching: alternated reaching up to top cabinets or ceiling if no cabinets present. Keep equal weight on each leg. Start with one hand support on counter while other hand reaches and progress to no hand support with reaching. 5.   Looking Over Shoulders: With equal weight on each leg: alternate turning to look over your shoulders with one hand support on counter as needed. Shift weight to side looking, pull hip then shoulder then head/eyes around to look behind you. Start with one hand support & progress to no hand support. 6. Stepping in lower cabinet:  Open lower cabinet doors. Tighten hip muscles & step right foot into lower cabinet under CONTROL (even if need support of both hands while stepping). Try  to release hands from sink to balance for 3-5 seconds. Tighten muscles & step back to floor. Repeat with left foot.

## 2017-01-26 NOTE — Therapy (Signed)
[facility] [address] [place], [name], [address] Phone: [phone]   Fax:  [phone]  Physical Therapy Treatment  Patient Details  Name: Tony Fisher MRN: [id number] Date of Birth: December 07, 1970 Referring Provider: Reggie Pile, PA   Encounter Date: 01/25/2017  PT End of Session - 01/25/17 2048    Visit Number  1    Number of Visits  10    Date for PT Re-Evaluation  03/17/17    Authorization Type  Medicare G-Code & progress notes    PT Start Time  1338    PT Stop Time  1425    PT Time Calculation (min)  47 min    Equipment Utilized During Treatment  Gait belt    Activity Tolerance  Patient tolerated treatment well;Patient limited by fatigue    Behavior During Therapy  [facility] for tasks assessed/performed       Past Medical History:  Diagnosis Date  . Diabetes mellitus     Past Surgical History:  Procedure Laterality Date  . KIDNEY TRANSPLANT  2006  . TRANSPLANT PANCREATIC ALLOGRAFT  2006    There were no vitals filed for this visit.  Subjective Assessment - 01/25/17 1541    Subjective  He has been wearing prosthesis daily 3-4 hrs 2x/day. His bone is becoming more prominent but is not painful.     Pertinent History  Rt TTA, kidney transplant, HTN, PAD, DM1, neuropathy, sclerosis of B eyes, depression, failed prancreas transplant,     Patient Stated Goals  strength & stamina back to return to travel, carry groceries to 2nd floor condo, wife diagnosed with breast cancer & chemo within 1 week of amputation & he wants to help.     Currently in Pain?  No/denies                      [facility] Adult PT Treatment/Exercise - 01/25/17 1540      Self-Care   Self-Care  Lifting    Lifting  PT demo proper technique including foot position to pick up objects from floor. Pt return demo understanding with verbal cues.       Prosthetics   Prosthetic Care Comments   Donne liner with slight posterior  orientation to pin to displace soft tissue over distal tibia.     Current prosthetic wear tolerance (days/week)   daily    Current prosthetic wear tolerance (#hours/day)   3-4 hrs 2x//day. PT instructed to increase to 5-6hrs 2x/day drying limb & liner 1/2 way of each wear     Education Provided  Skin check;Residual limb care;Proper Donning;Proper wear schedule/adjustment    Person(s) Educated  Patient    Education Method  Explanation;Demonstration;Tactile cues;Verbal cues    Education Method  Verbalized understanding;Returned demonstration;Verbal cues required             PT Education - 01/25/17 1415    Education provided  Yes    Education Details  HEP at sink for midline    Person(s) Educated  Patient    Methods  Explanation;Demonstration;Tactile cues;Verbal cues;Handout    Comprehension  Verbalized understanding;Returned demonstration;Verbal cues required;Tactile cues required       PT Short Term Goals - 01/25/17 2049      PT SHORT TERM GOAL #1   Title  Patient verbalizes proper wear adjustment & skin modifications. (All STGs Target Date: 02/17/2017)    Time  4    Period  Weeks    Status  On-going  Target Date  02/17/17      PT SHORT TERM GOAL #2   Title  Patient tolerates wear daily >10 hrs total /day without increased skin issues or limb pain.     Time  4    Period  Weeks    Status  On-going    Target Date  02/17/17      PT SHORT TERM GOAL #3   Title  Patient picks up items from floor & reaches 5" without balance loss.     Time  4    Period  Weeks    Status  On-going    Target Date  02/17/17      PT SHORT TERM GOAL #4   Title  Patient ambulates 500' with prosthesis with supervision/ verbal cues for deviations.     Time  4    Period  Weeks    Status  On-going    Target Date  02/17/17      PT SHORT TERM GOAL #5   Title  patient negotiates ramps, curbs & stairs (1 rail) with prosthesis with supervision.     Time  4    Period  Weeks    Status  On-going     Target Date  02/17/17        PT Long Term Goals - 01/25/17 2050      PT LONG TERM GOAL #1   Title  Patient verbalizes & demonstrates proper prosthetic care to enable safe use of prosthesis. (All LTGs Target Date: 03/17/2017)    Time  8    Period  Weeks    Status  On-going    Target Date  03/17/16      PT LONG TERM GOAL #2   Title  Patient tolerates wear of prosthesis daily >90% of awake hours without skin issues or limb pain to enable function throughout his day.     Time  8    Period  Weeks    Status  New    Target Date  03/17/16      PT LONG TERM GOAL #3   Title  Berg Balance >36/56 to indicate lower fall risk.     Time  8    Period  Weeks    Status  On-going    Target Date  03/17/16      PT LONG TERM GOAL #4   Title  Functional Gait Assessment >/= 19/30 to indicate lower fall risk.     Time  8    Period  Weeks    Status  On-going    Target Date  03/17/16      PT LONG TERM GOAL #5   Title  Patient ambulates 1000' with prosthesis including grass, ramps & curbs modified independent for community access.     Time  8    Period  Weeks    Status  On-going    Target Date  03/17/16      PT LONG TERM GOAL #6   Title  Patient able to lift & carry 20# & carry items on stairs similar to carrying groceries into his condo to meet his goal.     Time  8    Period  Weeks    Status  On-going    Target Date  03/17/16            Plan - 01/25/17 2050    Clinical Impression Statement  Patient seems to understand HEP at sink including proprioception with weight shifts & movements.  He is tolerating increasing wear time with sweat managment.     Rehab Potential  Good    Clinical Impairments Affecting Rehab Potential  Rt TTA, 2nd kidney transplant, HTN, PAD, DM1, neuropathy, sclerosis of B eyes, depression, failed kidney/ prancreas transplant,    PT Frequency  1x / week    PT Duration  8 weeks    PT Treatment/Interventions  ADLs/Self Care Home Management;Gait training;Stair  training;Functional mobility training;Therapeutic activities;Balance training;Therapeutic exercise;DME Instruction;Neuromuscular re-education;Patient/family education;Orthotic Fit/Training;Prosthetic Training;Passive range of motion    PT Next Visit Plan  review prosthetic care, instruct in adjusting ply socks with wearing too few, too many & corret amount, prosthetic gait    Consulted and Agree with Plan of Care  Patient       Patient will benefit from skilled therapeutic intervention in order to improve the following deficits and impairments:  Abnormal gait, Decreased activity tolerance, Decreased balance, Decreased endurance, Decreased coordination, Decreased knowledge of use of DME, Decreased mobility, Decreased strength, Impaired flexibility, Postural dysfunction, Prosthetic Dependency  Visit Diagnosis: Other abnormalities of gait and mobility  Foot drop, left  Muscle weakness (generalized)  Unsteadiness on feet     Problem List There are no active problems to display for this patient.   Jamey Reas PT, DPT 01/26/2017, 1:01 AM  [facility] [address] [place], [name], [address] Phone: [phone]   Fax:  [phone]  Name: Tony Fisher MRN: [id number] Date of Birth: [birth year]

## 2017-02-02 ENCOUNTER — Ambulatory Visit: Payer: Medicare Other | Attending: Surgical | Admitting: Physical Therapy

## 2017-02-02 ENCOUNTER — Encounter: Payer: Self-pay | Admitting: Physical Therapy

## 2017-02-02 DIAGNOSIS — M6281 Muscle weakness (generalized): Secondary | ICD-10-CM

## 2017-02-02 DIAGNOSIS — M21372 Foot drop, left foot: Secondary | ICD-10-CM | POA: Insufficient documentation

## 2017-02-02 DIAGNOSIS — R2689 Other abnormalities of gait and mobility: Secondary | ICD-10-CM | POA: Diagnosis not present

## 2017-02-02 DIAGNOSIS — R2681 Unsteadiness on feet: Secondary | ICD-10-CM | POA: Diagnosis not present

## 2017-02-02 NOTE — Therapy (Signed)
[facility] [address] [place], [name], [address] Phone: [phone]   Fax:  [phone]  Physical Therapy Treatment  Patient Details  Name: Tony Fisher MRN: [id number] Date of Birth: 1970/10/08 Referring Provider: Reggie Pile, PA   Encounter Date: 02/02/2017  PT End of Session - 02/02/17 1105    Visit Number  3 number adjusted due to misnumbering last session    Number of Visits  10    Date for PT Re-Evaluation  03/17/17    Authorization Type  Medicare G-Code & progress notes    PT Start Time  1104    PT Stop Time  1145    PT Time Calculation (min)  41 min    Equipment Utilized During Treatment  Gait belt    Activity Tolerance  Patient tolerated treatment well;Patient limited by fatigue    Behavior During Therapy  [facility] for tasks assessed/performed       Past Medical History:  Diagnosis Date  . Diabetes mellitus     Past Surgical History:  Procedure Laterality Date  . KIDNEY TRANSPLANT  2006  . TRANSPLANT PANCREATIC ALLOGRAFT  2006    There were no vitals filed for this visit.  Subjective Assessment - 02/02/17 1105    Subjective  No new complaints. No falls or pain to report. See's Tony Fisher today for prosthetic adjustments.     Pertinent History  Rt TTA, kidney transplant, HTN, PAD, DM1, neuropathy, sclerosis of B eyes, depression, failed prancreas transplant,     Patient Stated Goals  strength & stamina back to return to travel, carry groceries to 2nd floor condo, wife diagnosed with breast cancer & chemo within 1 week of amputation & he wants to help.     Currently in Pain?  No/denies            [facility] Adult PT Treatment/Exercise - 02/02/17 1106      Transfers   Transfers  Sit to Stand;Stand to Sit    Sit to Stand  5: Supervision;Without upper extremity assist;From chair/3-in-1    Stand to Sit  5: Supervision;With upper extremity assist;With armrests;To chair/3-in-1      Ambulation/Gait   Ambulation/Gait  Yes    Ambulation/Gait Assistance  5: Supervision    Ambulation/Gait Assistance Details  cues for base of support, step placement and weight shifting with gait    Ambulation Distance (Feet)  230 Feet    Assistive device  Prosthesis;None    Gait Pattern  Step-through pattern;Decreased arm swing - right;Decreased step length - left;Decreased stance time - right;Decreased stride length;Decreased weight shift to right;Right circumduction;Right hip hike;Left hip hike;Left steppage;Left foot flat;Right genu recurvatum;Left genu recurvatum;Antalgic;Lateral hip instability;Poor foot clearance - left    Ambulation Surface  Level;Indoor    Stairs  Yes    Stairs Assistance  5: Supervision    Stairs Assistance Details (indicate cue type and reason)  pt demo'd good technique/foot placement, reminder cues to advance hands along rails    Stair Management Technique  One rail Right;Alternating pattern;Forwards    Number of Stairs  4    Height of Stairs  6    Ramp  Other (comment);4: Min assist    Ramp Details (indicate cue type and reason)  block practice working on technique for improved knee control with prosthesis. pt needed single UE support and cues to slow down to work on technique which improved with skilled instruction.  Curb  5: Supervision    Curb Details (indicate cue type and reason)  no cues or assistance needed    Gait Comments  had pt walk the blue/white tile line while looking at feet to work on step placement with cues on step length and weight shifitng x 6 laps along back of gym      Prosthetics   Current prosthetic wear tolerance (days/week)   daily    Current prosthetic wear tolerance (#hours/day)   4-5 hours 2x day    Residual limb condition   pt reports no new issues, reports the blister is healing    Education Provided  Residual limb care;Proper wear schedule/adjustment;Proper weight-bearing schedule/adjustment    Person(s) Educated  Patient     Education Method  Explanation;Demonstration    Education Method  Verbalized understanding;Verbal cues required;Needs further instruction    Donning Prosthesis  Supervision    Doffing Prosthesis  Supervision          PT Short Term Goals - 01/25/17 2049      PT SHORT TERM GOAL #1   Title  Patient verbalizes proper wear adjustment & skin modifications. (All STGs Target Date: 02/17/2017)    Time  4    Period  Weeks    Status  On-going    Target Date  02/17/17      PT SHORT TERM GOAL #2   Title  Patient tolerates wear daily >10 hrs total /day without increased skin issues or limb pain.     Time  4    Period  Weeks    Status  On-going    Target Date  02/17/17      PT SHORT TERM GOAL #3   Title  Patient picks up items from floor & reaches 5" without balance loss.     Time  4    Period  Weeks    Status  On-going    Target Date  02/17/17      PT SHORT TERM GOAL #4   Title  Patient ambulates 500' with prosthesis with supervision/ verbal cues for deviations.     Time  4    Period  Weeks    Status  On-going    Target Date  02/17/17      PT SHORT TERM GOAL #5   Title  patient negotiates ramps, curbs & stairs (1 rail) with prosthesis with supervision.     Time  4    Period  Weeks    Status  On-going    Target Date  02/17/17        PT Long Term Goals - 01/25/17 2050      PT LONG TERM GOAL #1   Title  Patient verbalizes & demonstrates proper prosthetic care to enable safe use of prosthesis. (All LTGs Target Date: 03/17/2017)    Time  8    Period  Weeks    Status  On-going    Target Date  03/17/16      PT LONG TERM GOAL #2   Title  Patient tolerates wear of prosthesis daily >90% of awake hours without skin issues or limb pain to enable function throughout his day.     Time  8    Period  Weeks    Status  New    Target Date  03/17/16      PT LONG TERM GOAL #3   Title  Berg Balance >36/56 to indicate lower fall risk.     Time  8  Period  Weeks    Status  On-going     Target Date  03/17/16      PT LONG TERM GOAL #4   Title  Functional Gait Assessment >/= 19/30 to indicate lower fall risk.     Time  8    Period  Weeks    Status  On-going    Target Date  03/17/16      PT LONG TERM GOAL #5   Title  Patient ambulates 1000' with prosthesis including grass, ramps & curbs modified independent for community access.     Time  8    Period  Weeks    Status  On-going    Target Date  03/17/16      PT LONG TERM GOAL #6   Title  Patient able to lift & carry 20# & carry items on stairs similar to carrying groceries into his condo to meet his goal.     Time  8    Period  Weeks    Status  On-going    Target Date  03/17/16            Plan - 02/02/17 1106    Clinical Impression Statement  Today's skilled session continued to address gait/barrier's with prosthesis only. Pt will need continued work on ramps due to imbalance and decreased knee control. Also discussed foot drop on left side and possible brace options that would not be as heavy as his double upright brace. Pt is to bring in sneakers next session to try foot up and  posterior ottobock braces. Pt is progressing toward goals and should benefit from continued PT to progress toward unmet goals    Rehab Potential  Good    Clinical Impairments Affecting Rehab Potential  Rt TTA, 2nd kidney transplant, HTN, PAD, DM1, neuropathy, sclerosis of B eyes, depression, failed kidney/ prancreas transplant,    PT Frequency  1x / week    PT Duration  8 weeks    PT Treatment/Interventions  ADLs/Self Care Home Management;Gait training;Stair training;Functional mobility training;Therapeutic activities;Balance training;Therapeutic exercise;DME Instruction;Neuromuscular re-education;Patient/family education;Orthotic Fit/Training;Prosthetic Training;Passive range of motion    PT Next Visit Plan  review prosthetic care, instruct in adjusting ply socks with wearing too few, too many & corret amount, prosthetic gait    Consulted  and Agree with Plan of Care  Patient       Patient will benefit from skilled therapeutic intervention in order to improve the following deficits and impairments:  Abnormal gait, Decreased activity tolerance, Decreased balance, Decreased endurance, Decreased coordination, Decreased knowledge of use of DME, Decreased mobility, Decreased strength, Impaired flexibility, Postural dysfunction, Prosthetic Dependency  Visit Diagnosis: Other abnormalities of gait and mobility  Foot drop, left  Muscle weakness (generalized)  Unsteadiness on feet     Problem List There are no active problems to display for this patient.   Willow Ora, PTA, [facility] [address] [place], [place] [address] [phone] 02/02/17, 9:42 PM   Name: Tony Fisher MRN: [id number] Date of Birth: 1970/12/27

## 2017-02-07 DIAGNOSIS — E1021 Type 1 diabetes mellitus with diabetic nephropathy: Secondary | ICD-10-CM | POA: Diagnosis not present

## 2017-02-07 DIAGNOSIS — Z48298 Encounter for aftercare following other organ transplant: Secondary | ICD-10-CM | POA: Diagnosis not present

## 2017-02-08 ENCOUNTER — Ambulatory Visit: Payer: Medicare Other | Admitting: Physical Therapy

## 2017-02-15 ENCOUNTER — Ambulatory Visit: Payer: Medicare Other | Admitting: Physical Therapy

## 2017-02-22 ENCOUNTER — Ambulatory Visit: Payer: Medicare Other | Admitting: Physical Therapy

## 2017-03-01 ENCOUNTER — Ambulatory Visit: Payer: Medicare Other | Admitting: Physical Therapy

## 2017-03-07 DIAGNOSIS — Z794 Long term (current) use of insulin: Secondary | ICD-10-CM | POA: Diagnosis not present

## 2017-03-07 DIAGNOSIS — Z940 Kidney transplant status: Secondary | ICD-10-CM | POA: Diagnosis not present

## 2017-03-07 DIAGNOSIS — E1042 Type 1 diabetes mellitus with diabetic polyneuropathy: Secondary | ICD-10-CM | POA: Diagnosis not present

## 2017-03-07 DIAGNOSIS — Z89511 Acquired absence of right leg below knee: Secondary | ICD-10-CM | POA: Diagnosis not present

## 2017-03-07 DIAGNOSIS — Z9641 Presence of insulin pump (external) (internal): Secondary | ICD-10-CM | POA: Diagnosis not present

## 2017-03-07 DIAGNOSIS — E10319 Type 1 diabetes mellitus with unspecified diabetic retinopathy without macular edema: Secondary | ICD-10-CM | POA: Diagnosis not present

## 2017-03-07 DIAGNOSIS — E1021 Type 1 diabetes mellitus with diabetic nephropathy: Secondary | ICD-10-CM | POA: Diagnosis not present

## 2017-03-08 ENCOUNTER — Encounter: Payer: Medicare Other | Admitting: Physical Therapy

## 2017-03-10 DIAGNOSIS — Z940 Kidney transplant status: Secondary | ICD-10-CM | POA: Diagnosis not present

## 2017-03-10 DIAGNOSIS — R509 Fever, unspecified: Secondary | ICD-10-CM | POA: Diagnosis not present

## 2017-03-10 DIAGNOSIS — J101 Influenza due to other identified influenza virus with other respiratory manifestations: Secondary | ICD-10-CM | POA: Diagnosis not present

## 2017-03-10 DIAGNOSIS — Z9483 Pancreas transplant status: Secondary | ICD-10-CM | POA: Diagnosis not present

## 2017-03-10 DIAGNOSIS — I5030 Unspecified diastolic (congestive) heart failure: Secondary | ICD-10-CM | POA: Diagnosis not present

## 2017-03-10 DIAGNOSIS — R05 Cough: Secondary | ICD-10-CM | POA: Diagnosis not present

## 2017-03-10 DIAGNOSIS — E1022 Type 1 diabetes mellitus with diabetic chronic kidney disease: Secondary | ICD-10-CM | POA: Diagnosis not present

## 2017-03-10 DIAGNOSIS — R0602 Shortness of breath: Secondary | ICD-10-CM | POA: Diagnosis not present

## 2017-03-10 DIAGNOSIS — R531 Weakness: Secondary | ICD-10-CM | POA: Diagnosis not present

## 2017-03-10 DIAGNOSIS — J029 Acute pharyngitis, unspecified: Secondary | ICD-10-CM | POA: Diagnosis not present

## 2017-03-10 DIAGNOSIS — D849 Immunodeficiency, unspecified: Secondary | ICD-10-CM | POA: Diagnosis not present

## 2017-03-10 DIAGNOSIS — J111 Influenza due to unidentified influenza virus with other respiratory manifestations: Secondary | ICD-10-CM | POA: Diagnosis not present

## 2017-03-10 DIAGNOSIS — M791 Myalgia, unspecified site: Secondary | ICD-10-CM | POA: Diagnosis not present

## 2017-03-10 DIAGNOSIS — R1110 Vomiting, unspecified: Secondary | ICD-10-CM | POA: Diagnosis not present

## 2017-03-10 DIAGNOSIS — I130 Hypertensive heart and chronic kidney disease with heart failure and stage 1 through stage 4 chronic kidney disease, or unspecified chronic kidney disease: Secondary | ICD-10-CM | POA: Diagnosis not present

## 2017-03-11 DIAGNOSIS — N179 Acute kidney failure, unspecified: Secondary | ICD-10-CM | POA: Diagnosis not present

## 2017-03-11 DIAGNOSIS — I5030 Unspecified diastolic (congestive) heart failure: Secondary | ICD-10-CM | POA: Diagnosis not present

## 2017-03-11 DIAGNOSIS — E108 Type 1 diabetes mellitus with unspecified complications: Secondary | ICD-10-CM | POA: Diagnosis not present

## 2017-03-11 DIAGNOSIS — T8612 Kidney transplant failure: Secondary | ICD-10-CM | POA: Diagnosis not present

## 2017-03-11 DIAGNOSIS — I129 Hypertensive chronic kidney disease with stage 1 through stage 4 chronic kidney disease, or unspecified chronic kidney disease: Secondary | ICD-10-CM | POA: Diagnosis not present

## 2017-03-11 DIAGNOSIS — R0602 Shortness of breath: Secondary | ICD-10-CM | POA: Diagnosis not present

## 2017-03-11 DIAGNOSIS — F329 Major depressive disorder, single episode, unspecified: Secondary | ICD-10-CM | POA: Diagnosis not present

## 2017-03-11 DIAGNOSIS — K219 Gastro-esophageal reflux disease without esophagitis: Secondary | ICD-10-CM | POA: Diagnosis not present

## 2017-03-11 DIAGNOSIS — Z89512 Acquired absence of left leg below knee: Secondary | ICD-10-CM | POA: Diagnosis not present

## 2017-03-11 DIAGNOSIS — Z79899 Other long term (current) drug therapy: Secondary | ICD-10-CM | POA: Diagnosis not present

## 2017-03-11 DIAGNOSIS — Z9641 Presence of insulin pump (external) (internal): Secondary | ICD-10-CM | POA: Diagnosis not present

## 2017-03-11 DIAGNOSIS — J111 Influenza due to unidentified influenza virus with other respiratory manifestations: Secondary | ICD-10-CM | POA: Diagnosis not present

## 2017-03-11 DIAGNOSIS — Z940 Kidney transplant status: Secondary | ICD-10-CM | POA: Diagnosis not present

## 2017-03-11 DIAGNOSIS — E1029 Type 1 diabetes mellitus with other diabetic kidney complication: Secondary | ICD-10-CM | POA: Diagnosis not present

## 2017-03-11 DIAGNOSIS — N186 End stage renal disease: Secondary | ICD-10-CM | POA: Diagnosis not present

## 2017-03-11 DIAGNOSIS — Z794 Long term (current) use of insulin: Secondary | ICD-10-CM | POA: Diagnosis not present

## 2017-03-12 DIAGNOSIS — Z809 Family history of malignant neoplasm, unspecified: Secondary | ICD-10-CM | POA: Diagnosis not present

## 2017-03-12 DIAGNOSIS — J111 Influenza due to unidentified influenza virus with other respiratory manifestations: Secondary | ICD-10-CM | POA: Diagnosis not present

## 2017-03-12 DIAGNOSIS — Z8261 Family history of arthritis: Secondary | ICD-10-CM | POA: Diagnosis not present

## 2017-03-12 DIAGNOSIS — Z7952 Long term (current) use of systemic steroids: Secondary | ICD-10-CM | POA: Diagnosis not present

## 2017-03-12 DIAGNOSIS — E108 Type 1 diabetes mellitus with unspecified complications: Secondary | ICD-10-CM | POA: Diagnosis not present

## 2017-03-12 DIAGNOSIS — Z9483 Pancreas transplant status: Secondary | ICD-10-CM | POA: Diagnosis not present

## 2017-03-12 DIAGNOSIS — E1022 Type 1 diabetes mellitus with diabetic chronic kidney disease: Secondary | ICD-10-CM | POA: Diagnosis present

## 2017-03-12 DIAGNOSIS — N179 Acute kidney failure, unspecified: Secondary | ICD-10-CM | POA: Diagnosis not present

## 2017-03-12 DIAGNOSIS — R509 Fever, unspecified: Secondary | ICD-10-CM | POA: Diagnosis not present

## 2017-03-12 DIAGNOSIS — T8612 Kidney transplant failure: Secondary | ICD-10-CM | POA: Diagnosis not present

## 2017-03-12 DIAGNOSIS — K219 Gastro-esophageal reflux disease without esophagitis: Secondary | ICD-10-CM | POA: Diagnosis present

## 2017-03-12 DIAGNOSIS — Z794 Long term (current) use of insulin: Secondary | ICD-10-CM | POA: Diagnosis not present

## 2017-03-12 DIAGNOSIS — B348 Other viral infections of unspecified site: Secondary | ICD-10-CM | POA: Diagnosis not present

## 2017-03-12 DIAGNOSIS — N189 Chronic kidney disease, unspecified: Secondary | ICD-10-CM | POA: Diagnosis present

## 2017-03-12 DIAGNOSIS — Z8349 Family history of other endocrine, nutritional and metabolic diseases: Secondary | ICD-10-CM | POA: Diagnosis not present

## 2017-03-12 DIAGNOSIS — Z7982 Long term (current) use of aspirin: Secondary | ICD-10-CM | POA: Diagnosis not present

## 2017-03-12 DIAGNOSIS — Z89519 Acquired absence of unspecified leg below knee: Secondary | ICD-10-CM | POA: Diagnosis not present

## 2017-03-12 DIAGNOSIS — E875 Hyperkalemia: Secondary | ICD-10-CM | POA: Diagnosis present

## 2017-03-12 DIAGNOSIS — Z9641 Presence of insulin pump (external) (internal): Secondary | ICD-10-CM | POA: Diagnosis present

## 2017-03-12 DIAGNOSIS — Z888 Allergy status to other drugs, medicaments and biological substances status: Secondary | ICD-10-CM | POA: Diagnosis not present

## 2017-03-12 DIAGNOSIS — F329 Major depressive disorder, single episode, unspecified: Secondary | ICD-10-CM | POA: Diagnosis present

## 2017-03-12 DIAGNOSIS — I130 Hypertensive heart and chronic kidney disease with heart failure and stage 1 through stage 4 chronic kidney disease, or unspecified chronic kidney disease: Secondary | ICD-10-CM | POA: Diagnosis present

## 2017-03-12 DIAGNOSIS — Z79899 Other long term (current) drug therapy: Secondary | ICD-10-CM | POA: Diagnosis not present

## 2017-03-12 DIAGNOSIS — I5030 Unspecified diastolic (congestive) heart failure: Secondary | ICD-10-CM | POA: Diagnosis present

## 2017-03-12 DIAGNOSIS — A419 Sepsis, unspecified organism: Secondary | ICD-10-CM | POA: Diagnosis not present

## 2017-03-12 DIAGNOSIS — Z940 Kidney transplant status: Secondary | ICD-10-CM | POA: Diagnosis not present

## 2017-03-12 DIAGNOSIS — J101 Influenza due to other identified influenza virus with other respiratory manifestations: Secondary | ICD-10-CM | POA: Insufficient documentation

## 2017-03-12 DIAGNOSIS — B9789 Other viral agents as the cause of diseases classified elsewhere: Secondary | ICD-10-CM | POA: Diagnosis present

## 2017-03-12 DIAGNOSIS — Z8249 Family history of ischemic heart disease and other diseases of the circulatory system: Secondary | ICD-10-CM | POA: Diagnosis not present

## 2017-03-15 ENCOUNTER — Encounter: Payer: Medicare Other | Admitting: Physical Therapy

## 2017-03-22 ENCOUNTER — Encounter: Payer: Medicare Other | Admitting: Physical Therapy

## 2017-03-27 DIAGNOSIS — N319 Neuromuscular dysfunction of bladder, unspecified: Secondary | ICD-10-CM | POA: Diagnosis not present

## 2017-03-27 DIAGNOSIS — E875 Hyperkalemia: Secondary | ICD-10-CM | POA: Diagnosis not present

## 2017-03-27 DIAGNOSIS — Z955 Presence of coronary angioplasty implant and graft: Secondary | ICD-10-CM | POA: Diagnosis not present

## 2017-03-27 DIAGNOSIS — I10 Essential (primary) hypertension: Secondary | ICD-10-CM | POA: Diagnosis not present

## 2017-03-27 DIAGNOSIS — T86891 Other transplanted tissue failure: Secondary | ICD-10-CM | POA: Diagnosis not present

## 2017-03-27 DIAGNOSIS — Z792 Long term (current) use of antibiotics: Secondary | ICD-10-CM | POA: Diagnosis not present

## 2017-03-27 DIAGNOSIS — E109 Type 1 diabetes mellitus without complications: Secondary | ICD-10-CM | POA: Diagnosis not present

## 2017-03-27 DIAGNOSIS — Z9641 Presence of insulin pump (external) (internal): Secondary | ICD-10-CM | POA: Diagnosis not present

## 2017-03-27 DIAGNOSIS — Z4822 Encounter for aftercare following kidney transplant: Secondary | ICD-10-CM | POA: Diagnosis not present

## 2017-03-27 DIAGNOSIS — Z86718 Personal history of other venous thrombosis and embolism: Secondary | ICD-10-CM | POA: Diagnosis not present

## 2017-03-27 DIAGNOSIS — D8989 Other specified disorders involving the immune mechanism, not elsewhere classified: Secondary | ICD-10-CM | POA: Diagnosis not present

## 2017-03-27 DIAGNOSIS — Z9483 Pancreas transplant status: Secondary | ICD-10-CM | POA: Diagnosis not present

## 2017-03-27 DIAGNOSIS — Z79899 Other long term (current) drug therapy: Secondary | ICD-10-CM | POA: Diagnosis not present

## 2017-03-27 DIAGNOSIS — Z794 Long term (current) use of insulin: Secondary | ICD-10-CM | POA: Diagnosis not present

## 2017-03-27 DIAGNOSIS — Z940 Kidney transplant status: Secondary | ICD-10-CM | POA: Diagnosis not present

## 2017-04-03 DIAGNOSIS — Z4822 Encounter for aftercare following kidney transplant: Secondary | ICD-10-CM | POA: Diagnosis not present

## 2017-04-06 DIAGNOSIS — L97421 Non-pressure chronic ulcer of left heel and midfoot limited to breakdown of skin: Secondary | ICD-10-CM | POA: Diagnosis not present

## 2017-04-06 DIAGNOSIS — R2689 Other abnormalities of gait and mobility: Secondary | ICD-10-CM | POA: Diagnosis not present

## 2017-04-06 DIAGNOSIS — E1129 Type 2 diabetes mellitus with other diabetic kidney complication: Secondary | ICD-10-CM | POA: Diagnosis not present

## 2017-04-13 DIAGNOSIS — R2689 Other abnormalities of gait and mobility: Secondary | ICD-10-CM | POA: Diagnosis not present

## 2017-04-13 DIAGNOSIS — Z89519 Acquired absence of unspecified leg below knee: Secondary | ICD-10-CM | POA: Diagnosis not present

## 2017-04-13 DIAGNOSIS — E1129 Type 2 diabetes mellitus with other diabetic kidney complication: Secondary | ICD-10-CM | POA: Diagnosis not present

## 2017-04-13 DIAGNOSIS — L97421 Non-pressure chronic ulcer of left heel and midfoot limited to breakdown of skin: Secondary | ICD-10-CM | POA: Diagnosis not present

## 2017-04-20 DIAGNOSIS — L97421 Non-pressure chronic ulcer of left heel and midfoot limited to breakdown of skin: Secondary | ICD-10-CM | POA: Diagnosis not present

## 2017-04-20 DIAGNOSIS — E1129 Type 2 diabetes mellitus with other diabetic kidney complication: Secondary | ICD-10-CM | POA: Diagnosis not present

## 2017-04-20 DIAGNOSIS — Z89519 Acquired absence of unspecified leg below knee: Secondary | ICD-10-CM | POA: Diagnosis not present

## 2017-04-20 DIAGNOSIS — R2689 Other abnormalities of gait and mobility: Secondary | ICD-10-CM | POA: Diagnosis not present

## 2017-04-25 ENCOUNTER — Encounter: Payer: Self-pay | Admitting: Physical Therapy

## 2017-04-25 NOTE — Therapy (Signed)
[facility] [address] [place], [name], [address] Phone: [phone]   Fax:  [phone]  Patient Details  Name: Tony Fisher MRN: [id number] Date of Birth: 1970/09/25 Referring Provider:  Reggie Pile, PA  Encounter Date: 04/25/2017  PHYSICAL THERAPY DISCHARGE SUMMARY  Visits from Start of Care: 3  Last visit on 02/02/2017  Current functional level related to goals / functional outcomes: Unknown as patient did not return after 3rd appointment.    Remaining deficits: unknown   Education / Equipment: Prosthetic care  Plan: Patient agrees to discharge.  Patient goals were not met. Patient is being discharged due to not returning since the last visit.  ?????         Joyce Leckey PT, DPT 04/25/2017, 10:53 AM  [facility] [address] [place], [name], [address] Phone: [phone]   Fax:  [phone]

## 2017-04-27 DIAGNOSIS — Z89519 Acquired absence of unspecified leg below knee: Secondary | ICD-10-CM | POA: Diagnosis not present

## 2017-04-27 DIAGNOSIS — L97421 Non-pressure chronic ulcer of left heel and midfoot limited to breakdown of skin: Secondary | ICD-10-CM | POA: Diagnosis not present

## 2017-04-27 DIAGNOSIS — R2689 Other abnormalities of gait and mobility: Secondary | ICD-10-CM | POA: Diagnosis not present

## 2017-04-27 DIAGNOSIS — E1129 Type 2 diabetes mellitus with other diabetic kidney complication: Secondary | ICD-10-CM | POA: Diagnosis not present

## 2017-05-02 DIAGNOSIS — N185 Chronic kidney disease, stage 5: Secondary | ICD-10-CM | POA: Diagnosis not present

## 2017-05-02 DIAGNOSIS — E1122 Type 2 diabetes mellitus with diabetic chronic kidney disease: Secondary | ICD-10-CM | POA: Diagnosis not present

## 2017-05-02 DIAGNOSIS — N2581 Secondary hyperparathyroidism of renal origin: Secondary | ICD-10-CM | POA: Diagnosis not present

## 2017-05-02 DIAGNOSIS — Z940 Kidney transplant status: Secondary | ICD-10-CM | POA: Diagnosis not present

## 2017-05-02 DIAGNOSIS — I120 Hypertensive chronic kidney disease with stage 5 chronic kidney disease or end stage renal disease: Secondary | ICD-10-CM | POA: Diagnosis not present

## 2017-05-02 DIAGNOSIS — D631 Anemia in chronic kidney disease: Secondary | ICD-10-CM | POA: Diagnosis not present

## 2017-05-05 DIAGNOSIS — L97522 Non-pressure chronic ulcer of other part of left foot with fat layer exposed: Secondary | ICD-10-CM | POA: Diagnosis not present

## 2017-05-05 DIAGNOSIS — E1129 Type 2 diabetes mellitus with other diabetic kidney complication: Secondary | ICD-10-CM | POA: Diagnosis not present

## 2017-05-08 DIAGNOSIS — Z4822 Encounter for aftercare following kidney transplant: Secondary | ICD-10-CM | POA: Diagnosis not present

## 2017-05-11 DIAGNOSIS — E1129 Type 2 diabetes mellitus with other diabetic kidney complication: Secondary | ICD-10-CM | POA: Diagnosis not present

## 2017-05-11 DIAGNOSIS — L97522 Non-pressure chronic ulcer of other part of left foot with fat layer exposed: Secondary | ICD-10-CM | POA: Diagnosis not present

## 2017-05-18 DIAGNOSIS — Z4822 Encounter for aftercare following kidney transplant: Secondary | ICD-10-CM | POA: Diagnosis not present

## 2017-05-18 DIAGNOSIS — L97522 Non-pressure chronic ulcer of other part of left foot with fat layer exposed: Secondary | ICD-10-CM | POA: Diagnosis not present

## 2017-05-25 DIAGNOSIS — E11621 Type 2 diabetes mellitus with foot ulcer: Secondary | ICD-10-CM | POA: Diagnosis not present

## 2017-05-25 DIAGNOSIS — L97522 Non-pressure chronic ulcer of other part of left foot with fat layer exposed: Secondary | ICD-10-CM | POA: Diagnosis not present

## 2017-06-01 DIAGNOSIS — L97522 Non-pressure chronic ulcer of other part of left foot with fat layer exposed: Secondary | ICD-10-CM | POA: Diagnosis not present

## 2017-06-01 DIAGNOSIS — E1129 Type 2 diabetes mellitus with other diabetic kidney complication: Secondary | ICD-10-CM | POA: Diagnosis not present

## 2017-06-01 DIAGNOSIS — L97421 Non-pressure chronic ulcer of left heel and midfoot limited to breakdown of skin: Secondary | ICD-10-CM | POA: Diagnosis not present

## 2017-06-01 DIAGNOSIS — L98499 Non-pressure chronic ulcer of skin of other sites with unspecified severity: Secondary | ICD-10-CM | POA: Diagnosis not present

## 2017-06-01 DIAGNOSIS — L97429 Non-pressure chronic ulcer of left heel and midfoot with unspecified severity: Secondary | ICD-10-CM | POA: Diagnosis not present

## 2017-06-06 DIAGNOSIS — E1021 Type 1 diabetes mellitus with diabetic nephropathy: Secondary | ICD-10-CM | POA: Diagnosis not present

## 2017-06-08 DIAGNOSIS — L97522 Non-pressure chronic ulcer of other part of left foot with fat layer exposed: Secondary | ICD-10-CM | POA: Diagnosis not present

## 2017-06-09 ENCOUNTER — Other Ambulatory Visit (HOSPITAL_COMMUNITY): Payer: Self-pay | Admitting: Podiatry

## 2017-06-09 DIAGNOSIS — L97522 Non-pressure chronic ulcer of other part of left foot with fat layer exposed: Secondary | ICD-10-CM

## 2017-06-09 DIAGNOSIS — L97509 Non-pressure chronic ulcer of other part of unspecified foot with unspecified severity: Principal | ICD-10-CM

## 2017-06-09 DIAGNOSIS — M869 Osteomyelitis, unspecified: Principal | ICD-10-CM

## 2017-06-09 DIAGNOSIS — E11621 Type 2 diabetes mellitus with foot ulcer: Secondary | ICD-10-CM

## 2017-06-09 DIAGNOSIS — E1169 Type 2 diabetes mellitus with other specified complication: Principal | ICD-10-CM

## 2017-06-12 ENCOUNTER — Ambulatory Visit (HOSPITAL_COMMUNITY)
Admission: RE | Admit: 2017-06-12 | Discharge: 2017-06-12 | Disposition: A | Discharge status: Home / Self Care | Payer: Medicare Other | Source: Ambulatory Visit | Attending: Cardiovascular Disease | Admitting: Cardiovascular Disease

## 2017-06-12 DIAGNOSIS — L97429 Non-pressure chronic ulcer of left heel and midfoot with unspecified severity: Secondary | ICD-10-CM | POA: Diagnosis not present

## 2017-06-12 DIAGNOSIS — L97522 Non-pressure chronic ulcer of other part of left foot with fat layer exposed: Secondary | ICD-10-CM

## 2017-06-12 DIAGNOSIS — E11621 Type 2 diabetes mellitus with foot ulcer: Secondary | ICD-10-CM

## 2017-06-12 DIAGNOSIS — Z940 Kidney transplant status: Secondary | ICD-10-CM | POA: Diagnosis not present

## 2017-06-12 DIAGNOSIS — Z89511 Acquired absence of right leg below knee: Secondary | ICD-10-CM | POA: Diagnosis not present

## 2017-06-12 DIAGNOSIS — Z9483 Pancreas transplant status: Secondary | ICD-10-CM | POA: Insufficient documentation

## 2017-06-12 DIAGNOSIS — E1169 Type 2 diabetes mellitus with other specified complication: Secondary | ICD-10-CM

## 2017-06-12 DIAGNOSIS — I10 Essential (primary) hypertension: Secondary | ICD-10-CM | POA: Insufficient documentation

## 2017-06-12 DIAGNOSIS — L98499 Non-pressure chronic ulcer of skin of other sites with unspecified severity: Secondary | ICD-10-CM | POA: Diagnosis not present

## 2017-06-12 DIAGNOSIS — M869 Osteomyelitis, unspecified: Secondary | ICD-10-CM | POA: Diagnosis not present

## 2017-06-12 DIAGNOSIS — L97509 Non-pressure chronic ulcer of other part of unspecified foot with unspecified severity: Secondary | ICD-10-CM

## 2017-06-14 ENCOUNTER — Telehealth: Payer: Self-pay | Admitting: Cardiovascular Disease

## 2017-06-14 NOTE — Telephone Encounter (Signed)
Called patient to schedule his urgent appointment with Dr. Gwenlyn Found and the patient felt that as the tests were ok he did not need this appointment with Dr. Gwenlyn Found so he did not schedule one.

## 2017-06-15 DIAGNOSIS — E1129 Type 2 diabetes mellitus with other diabetic kidney complication: Secondary | ICD-10-CM | POA: Diagnosis not present

## 2017-06-15 DIAGNOSIS — L97522 Non-pressure chronic ulcer of other part of left foot with fat layer exposed: Secondary | ICD-10-CM | POA: Diagnosis not present

## 2017-06-15 DIAGNOSIS — L97429 Non-pressure chronic ulcer of left heel and midfoot with unspecified severity: Secondary | ICD-10-CM | POA: Diagnosis not present

## 2017-06-15 DIAGNOSIS — E11621 Type 2 diabetes mellitus with foot ulcer: Secondary | ICD-10-CM | POA: Diagnosis not present

## 2017-06-16 DIAGNOSIS — Z940 Kidney transplant status: Secondary | ICD-10-CM | POA: Diagnosis not present

## 2017-06-16 DIAGNOSIS — D631 Anemia in chronic kidney disease: Secondary | ICD-10-CM | POA: Diagnosis not present

## 2017-06-22 DIAGNOSIS — L97522 Non-pressure chronic ulcer of other part of left foot with fat layer exposed: Secondary | ICD-10-CM | POA: Diagnosis not present

## 2017-06-27 ENCOUNTER — Ambulatory Visit: Payer: Medicare Other | Admitting: Cardiovascular Disease

## 2017-07-04 ENCOUNTER — Ambulatory Visit: Payer: Medicare Other | Admitting: Cardiovascular Disease

## 2017-07-04 DIAGNOSIS — Z79899 Other long term (current) drug therapy: Secondary | ICD-10-CM | POA: Diagnosis not present

## 2017-07-04 DIAGNOSIS — L97519 Non-pressure chronic ulcer of other part of right foot with unspecified severity: Secondary | ICD-10-CM | POA: Diagnosis not present

## 2017-07-04 DIAGNOSIS — Z940 Kidney transplant status: Secondary | ICD-10-CM | POA: Diagnosis not present

## 2017-07-04 DIAGNOSIS — Z89512 Acquired absence of left leg below knee: Secondary | ICD-10-CM | POA: Diagnosis not present

## 2017-07-04 DIAGNOSIS — Z4822 Encounter for aftercare following kidney transplant: Secondary | ICD-10-CM | POA: Diagnosis not present

## 2017-07-04 DIAGNOSIS — D8989 Other specified disorders involving the immune mechanism, not elsewhere classified: Secondary | ICD-10-CM | POA: Diagnosis not present

## 2017-07-04 DIAGNOSIS — E109 Type 1 diabetes mellitus without complications: Secondary | ICD-10-CM | POA: Diagnosis not present

## 2017-07-04 DIAGNOSIS — N319 Neuromuscular dysfunction of bladder, unspecified: Secondary | ICD-10-CM | POA: Diagnosis not present

## 2017-07-04 DIAGNOSIS — Z9641 Presence of insulin pump (external) (internal): Secondary | ICD-10-CM | POA: Diagnosis not present

## 2017-07-04 DIAGNOSIS — Z792 Long term (current) use of antibiotics: Secondary | ICD-10-CM | POA: Diagnosis not present

## 2017-07-04 DIAGNOSIS — I10 Essential (primary) hypertension: Secondary | ICD-10-CM | POA: Diagnosis not present

## 2017-07-04 DIAGNOSIS — Z9483 Pancreas transplant status: Secondary | ICD-10-CM | POA: Diagnosis not present

## 2017-07-05 ENCOUNTER — Encounter: Payer: Self-pay | Admitting: Cardiovascular Disease

## 2017-07-05 ENCOUNTER — Encounter (HOSPITAL_BASED_OUTPATIENT_CLINIC_OR_DEPARTMENT_OTHER): Payer: Medicare Other | Attending: Physician Assistant

## 2017-07-05 DIAGNOSIS — Z940 Kidney transplant status: Secondary | ICD-10-CM | POA: Insufficient documentation

## 2017-07-05 DIAGNOSIS — I110 Hypertensive heart disease with heart failure: Secondary | ICD-10-CM | POA: Diagnosis not present

## 2017-07-05 DIAGNOSIS — E1129 Type 2 diabetes mellitus with other diabetic kidney complication: Secondary | ICD-10-CM | POA: Diagnosis not present

## 2017-07-05 DIAGNOSIS — L97422 Non-pressure chronic ulcer of left heel and midfoot with fat layer exposed: Secondary | ICD-10-CM | POA: Diagnosis not present

## 2017-07-05 DIAGNOSIS — Z89519 Acquired absence of unspecified leg below knee: Secondary | ICD-10-CM | POA: Diagnosis not present

## 2017-07-05 DIAGNOSIS — I509 Heart failure, unspecified: Secondary | ICD-10-CM | POA: Insufficient documentation

## 2017-07-05 DIAGNOSIS — Z89511 Acquired absence of right leg below knee: Secondary | ICD-10-CM | POA: Insufficient documentation

## 2017-07-05 DIAGNOSIS — E10621 Type 1 diabetes mellitus with foot ulcer: Secondary | ICD-10-CM | POA: Diagnosis not present

## 2017-07-05 DIAGNOSIS — E1051 Type 1 diabetes mellitus with diabetic peripheral angiopathy without gangrene: Secondary | ICD-10-CM | POA: Diagnosis not present

## 2017-07-05 DIAGNOSIS — E1040 Type 1 diabetes mellitus with diabetic neuropathy, unspecified: Secondary | ICD-10-CM | POA: Insufficient documentation

## 2017-07-05 DIAGNOSIS — E11621 Type 2 diabetes mellitus with foot ulcer: Secondary | ICD-10-CM | POA: Diagnosis not present

## 2017-07-05 DIAGNOSIS — E11628 Type 2 diabetes mellitus with other skin complications: Secondary | ICD-10-CM | POA: Diagnosis not present

## 2017-07-05 DIAGNOSIS — R2689 Other abnormalities of gait and mobility: Secondary | ICD-10-CM | POA: Diagnosis not present

## 2017-07-05 DIAGNOSIS — L89623 Pressure ulcer of left heel, stage 3: Secondary | ICD-10-CM | POA: Diagnosis not present

## 2017-07-11 ENCOUNTER — Encounter: Payer: Self-pay | Admitting: Cardiovascular Disease

## 2017-07-11 ENCOUNTER — Ambulatory Visit (INDEPENDENT_AMBULATORY_CARE_PROVIDER_SITE_OTHER): Payer: Medicare Other | Admitting: Cardiovascular Disease

## 2017-07-11 VITALS — BP 128/66 | HR 86 | Ht 74.0 in | Wt 157.0 lb

## 2017-07-11 DIAGNOSIS — I739 Peripheral vascular disease, unspecified: Secondary | ICD-10-CM

## 2017-07-11 DIAGNOSIS — I998 Other disorder of circulatory system: Secondary | ICD-10-CM

## 2017-07-11 DIAGNOSIS — Z940 Kidney transplant status: Secondary | ICD-10-CM | POA: Diagnosis not present

## 2017-07-11 DIAGNOSIS — Z4822 Encounter for aftercare following kidney transplant: Secondary | ICD-10-CM | POA: Diagnosis not present

## 2017-07-11 DIAGNOSIS — I70229 Atherosclerosis of native arteries of extremities with rest pain, unspecified extremity: Secondary | ICD-10-CM | POA: Insufficient documentation

## 2017-07-11 NOTE — Assessment & Plan Note (Signed)
[name] was referred to me by Dr. Redmond Baseman  for evaluation of critical limb ischemia.  He has a history of type 1 diabetes since 1984 on insulin pump.  He has had 2 renal transplants at [facility] recently 2017.  He had a right BKA at [facility] 8/18 for nonhealing ulcer.  He is wearing a prosthesis with out difficulty.  He developed a left heel ulcer several months ago which is slowly healing.  Recent Dopplers revealed a left ABI that was noncompressible because of calcification.  He had normal circulation ablation down to his knee by an occluded peroneal and posterior tibial vessel.  Given his serum creatinine is in the 1.9 range and the fact that he had a renal transplant I do not think he is a candidate for angiography or intervention.

## 2017-07-11 NOTE — Patient Instructions (Signed)
Medication Instructions:  Your physician recommends that you continue on your current medications as directed. Please refer to the Current Medication list given to you today.  Labwork: None   Testing/Procedures: None   Follow-Up: Follow up on a as needed basis   Any Other Special Instructions Will Be Listed Below (If Applicable). If you need a refill on your cardiac medications before your next appointment, please call your pharmacy.

## 2017-07-11 NOTE — Progress Notes (Signed)
07/11/2017 Tony Fisher   13-Jul-1970  [id number]  Primary Physician Tony Oh, MD Primary Cardiologist: Tony Harp MD Tony Fisher, [place]  HPI:  Tony Fisher is a 47 y.o. thin appearing male with no children is accompanied by his wife Tony Fisher today.  He was referred by Dr. Fritzi Fisher  his podiatrist for critical limb ischemia and a nonhealing ulcer on his left heel.  He has a history of type 1 diabetes since 1984 on insulin pump.  He had a remote renal transplant and more recently a second renal transplant in October 2017.  He had a right BKA August 2018 and has developed a left heel ulcer over the last several months which is healing slowly.  His creatinines run in the 1 9 range.  His nephrologist is Dr. Justin Fisher.  Recent Dopplers performed 06/14/2017 revealed an occluded posterior tibial and peroneal artery with a patent anterior tibial.   Current Meds  Medication Sig  . aspirin EC 81 MG tablet Take 81 mg by mouth daily.  Marland Kitchen gabapentin (NEURONTIN) 100 MG capsule Take 100 mg by mouth 2 (two) times daily.  . insulin aspart (NOVOLOG) 100 UNIT/ML injection Inject into the skin 3 (three) times daily before meals.  . mycophenolate (MYFORTIC) 180 MG EC tablet Take 720 mg by mouth 2 (two) times daily.  . pantoprazole (PROTONIX) 40 MG tablet Take 40 mg by mouth daily.  . predniSONE (DELTASONE) 10 MG tablet Take 10 mg by mouth daily.  . sertraline (ZOLOFT) 100 MG tablet Take 100 mg by mouth daily.  . tacrolimus (PROGRAF) 5 MG capsule Take 5 mg by mouth 2 (two) times daily.  . tamsulosin (FLOMAX) 0.4 MG CAPS capsule Take 0.4 mg by mouth daily.     Allergies  Allergen Reactions  . Iron Shortness Of Breath and Swelling  . Ace Inhibitors Hives and Swelling    Throat swelling  . Cheese Other (See Comments)    Patient does not like cheese    Social History   Socioeconomic History  . Marital status: Single    Spouse name: Not on file  . Number of children: Not on file  .  Years of education: Not on file  . Highest education level: Not on file  Occupational History  . Not on file  Social Needs  . Financial resource strain: Not on file  . Food insecurity:    Worry: Not on file    Inability: Not on file  . Transportation needs:    Medical: Not on file    Non-medical: Not on file  Tobacco Use  . Smoking status: Never Smoker  . Smokeless tobacco: Never Used  Substance and Sexual Activity  . Alcohol use: Yes    Comment: rarely  . Drug use: No  . Sexual activity: Not on file  Lifestyle  . Physical activity:    Days per week: Not on file    Minutes per session: Not on file  . Stress: Not on file  Relationships  . Social connections:    Talks on phone: Not on file    Gets together: Not on file    Attends religious service: Not on file    Active member of club or organization: Not on file    Attends meetings of clubs or organizations: Not on file    Relationship status: Not on file  . Intimate partner violence:    Fear of current or ex partner: Not on file  Emotionally abused: Not on file    Physically abused: Not on file    Forced sexual activity: Not on file  Other Topics Concern  . Not on file  Social History Narrative  . Not on file     Review of Systems: General: negative for chills, fever, night sweats or weight changes.  Cardiovascular: negative for chest pain, dyspnea on exertion, edema, orthopnea, palpitations, paroxysmal nocturnal dyspnea or shortness of breath Dermatological: negative for rash Respiratory: negative for cough or wheezing Urologic: negative for hematuria Abdominal: negative for nausea, vomiting, diarrhea, bright red blood per rectum, melena, or hematemesis Neurologic: negative for visual changes, syncope, or dizziness All other systems reviewed and are otherwise negative except as noted above.    Blood pressure 128/66, pulse 86, height 6\' 2"  (1.88 m), weight 157 lb (71.2 kg).  General appearance: alert and no  distress Neck: no adenopathy, no carotid bruit, no JVD, supple, symmetrical, trachea midline and thyroid not enlarged, symmetric, no tenderness/mass/nodules Lungs: clear to auscultation bilaterally Heart: regular rate and rhythm, S1, S2 normal, no murmur, click, rub or gallop Extremities: Right BKA Pulses: Absent pedal pulses on the left Skin: Skin color, texture, turgor normal. No rashes or lesions Neurologic: Alert and oriented X 3, normal strength and tone. Normal symmetric reflexes. Normal coordination and gait  EKG sinus rhythm at 86 without ST or T wave changes.  I personally reviewed this EKG  ASSESSMENT AND PLAN:   Critical lower limb ischemia [name] was referred to me by Tony Fisher  for evaluation of critical limb ischemia.  He has a history of type 1 diabetes since 1984 on insulin pump.  He has had 2 renal transplants at [facility] recently 2017.  He had a right BKA at [facility] 8/18 for nonhealing ulcer.  He is wearing a prosthesis with out difficulty.  He developed a left heel ulcer several months ago which is slowly healing.  Recent Dopplers revealed a left ABI that was noncompressible because of calcification.  He had normal circulation ablation down to his knee by an occluded peroneal and posterior tibial vessel.  Given his serum creatinine is in the 1.9 range and the fact that he had a renal transplant I do not think he is a candidate for angiography or intervention.      Tony Harp MD FACP,FACC,FAHA, [facility] 07/11/2017 3:56 PM

## 2017-07-12 DIAGNOSIS — L97522 Non-pressure chronic ulcer of other part of left foot with fat layer exposed: Secondary | ICD-10-CM | POA: Diagnosis not present

## 2017-07-12 DIAGNOSIS — R2689 Other abnormalities of gait and mobility: Secondary | ICD-10-CM | POA: Diagnosis not present

## 2017-07-12 DIAGNOSIS — Z89519 Acquired absence of unspecified leg below knee: Secondary | ICD-10-CM | POA: Diagnosis not present

## 2017-07-12 DIAGNOSIS — E1129 Type 2 diabetes mellitus with other diabetic kidney complication: Secondary | ICD-10-CM | POA: Diagnosis not present

## 2017-07-18 ENCOUNTER — Ambulatory Visit: Payer: Medicare Other | Admitting: Cardiovascular Disease

## 2017-07-21 ENCOUNTER — Other Ambulatory Visit: Payer: Self-pay | Admitting: *Deleted

## 2017-07-21 ENCOUNTER — Encounter: Payer: Self-pay | Admitting: *Deleted

## 2017-07-21 ENCOUNTER — Encounter: Payer: Self-pay | Admitting: Vascular Surgery

## 2017-07-21 ENCOUNTER — Ambulatory Visit (INDEPENDENT_AMBULATORY_CARE_PROVIDER_SITE_OTHER): Payer: Medicare Other | Admitting: Vascular Surgery

## 2017-07-21 ENCOUNTER — Other Ambulatory Visit: Payer: Self-pay

## 2017-07-21 ENCOUNTER — Encounter (HOSPITAL_BASED_OUTPATIENT_CLINIC_OR_DEPARTMENT_OTHER): Payer: Self-pay | Admitting: *Deleted

## 2017-07-21 VITALS — BP 121/80 | HR 97 | Temp 97.4°F | Resp 16 | Ht 74.0 in | Wt 156.2 lb

## 2017-07-21 DIAGNOSIS — I739 Peripheral vascular disease, unspecified: Secondary | ICD-10-CM | POA: Diagnosis not present
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
## 2017-07-21 NOTE — Progress Notes (Addendum)
Spoke w/ pt via phone for pre-op interview.  Npo after mn w/ exception clear liquids until 0800 (no cream/ milk products).  Needs istat 8.  Current ekg in chart and epic.  Will take am meds w/ sips of water.   Requested and receive lov note from dr webb via fax , placed with chart.  Have not received pt's pcp h&p from dr Baker Pierini office.  Per pt had called dr webb office to get it done today and faxed (made aware need h&p in order to have surgery with dr Baker Pierini). Reviewed pt chart w/ dr rose mda on 07-19-2017, ok to proceed and have pt clear liquids until 0800 dos.  ADDENDUM:  Received pt's h&p via fax from dr Baker Pierini, placed w/ chart.

## 2017-07-24 ENCOUNTER — Ambulatory Visit (HOSPITAL_BASED_OUTPATIENT_CLINIC_OR_DEPARTMENT_OTHER)
Admission: RE | Admit: 2017-07-24 | Discharge: 2017-07-24 | Disposition: A | Discharge status: Home / Self Care | Payer: Medicare Other | Source: Ambulatory Visit | Attending: Podiatry | Admitting: Podiatry

## 2017-07-24 ENCOUNTER — Other Ambulatory Visit: Payer: Self-pay

## 2017-07-24 ENCOUNTER — Encounter (HOSPITAL_BASED_OUTPATIENT_CLINIC_OR_DEPARTMENT_OTHER)
Admission: RE | Disposition: A | Discharge status: Home / Self Care | Payer: Self-pay | Source: Ambulatory Visit | Attending: Podiatry

## 2017-07-24 ENCOUNTER — Encounter (HOSPITAL_BASED_OUTPATIENT_CLINIC_OR_DEPARTMENT_OTHER): Payer: Self-pay | Admitting: *Deleted

## 2017-07-24 ENCOUNTER — Ambulatory Visit (HOSPITAL_BASED_OUTPATIENT_CLINIC_OR_DEPARTMENT_OTHER): Payer: Medicare Other | Admitting: Anesthesiology

## 2017-07-24 DIAGNOSIS — L97429 Non-pressure chronic ulcer of left heel and midfoot with unspecified severity: Secondary | ICD-10-CM | POA: Insufficient documentation

## 2017-07-24 DIAGNOSIS — Z794 Long term (current) use of insulin: Secondary | ICD-10-CM | POA: Diagnosis not present

## 2017-07-24 DIAGNOSIS — Z7982 Long term (current) use of aspirin: Secondary | ICD-10-CM | POA: Insufficient documentation

## 2017-07-24 DIAGNOSIS — E11621 Type 2 diabetes mellitus with foot ulcer: Secondary | ICD-10-CM | POA: Diagnosis not present

## 2017-07-24 DIAGNOSIS — N186 End stage renal disease: Secondary | ICD-10-CM | POA: Diagnosis not present

## 2017-07-24 DIAGNOSIS — Z992 Dependence on renal dialysis: Secondary | ICD-10-CM | POA: Insufficient documentation

## 2017-07-24 DIAGNOSIS — L97522 Non-pressure chronic ulcer of other part of left foot with fat layer exposed: Secondary | ICD-10-CM | POA: Diagnosis not present

## 2017-07-24 DIAGNOSIS — I998 Other disorder of circulatory system: Secondary | ICD-10-CM | POA: Diagnosis not present

## 2017-07-24 DIAGNOSIS — Z79899 Other long term (current) drug therapy: Secondary | ICD-10-CM | POA: Diagnosis not present

## 2017-07-24 DIAGNOSIS — E1122 Type 2 diabetes mellitus with diabetic chronic kidney disease: Secondary | ICD-10-CM | POA: Diagnosis not present

## 2017-07-24 DIAGNOSIS — I120 Hypertensive chronic kidney disease with stage 5 chronic kidney disease or end stage renal disease: Secondary | ICD-10-CM | POA: Insufficient documentation

## 2017-07-24 DIAGNOSIS — L97529 Non-pressure chronic ulcer of other part of left foot with unspecified severity: Secondary | ICD-10-CM | POA: Diagnosis not present

## 2017-07-24 DIAGNOSIS — E10621 Type 1 diabetes mellitus with foot ulcer: Secondary | ICD-10-CM | POA: Diagnosis not present

## 2017-07-24 HISTORY — DX: Personal history of other diseases of the circulatory system: Z86.79

## 2017-07-24 HISTORY — DX: Type 2 diabetes mellitus with proliferative diabetic retinopathy without macular edema, bilateral: E11.3593

## 2017-07-24 HISTORY — DX: Kidney transplant status: Z94.0

## 2017-07-24 HISTORY — DX: Type 1 diabetes mellitus without complications: E10.9

## 2017-07-24 HISTORY — DX: Secondary hyperparathyroidism of renal origin: N25.81

## 2017-07-24 HISTORY — DX: Non-pressure chronic ulcer of other part of unspecified foot with unspecified severity: L97.509

## 2017-07-24 HISTORY — DX: Anemia in chronic kidney disease: D63.1

## 2017-07-24 HISTORY — DX: Personal history of other (healed) physical injury and trauma: Z87.828

## 2017-07-24 HISTORY — DX: Chronic kidney disease, stage 3 unspecified: N18.30

## 2017-07-24 HISTORY — DX: Disorder of arteries and arterioles, unspecified: I77.9

## 2017-07-24 HISTORY — DX: Presence of spectacles and contact lenses: Z97.3

## 2017-07-24 HISTORY — DX: Personal history of other diseases of urinary system: Z87.448

## 2017-07-24 HISTORY — DX: Acquired absence of right leg below knee: Z89.511

## 2017-07-24 HISTORY — DX: Chronic kidney disease, unspecified: N18.9

## 2017-07-24 HISTORY — DX: Personal history of other diseases of the musculoskeletal system and connective tissue: Z87.39

## 2017-07-24 HISTORY — DX: Other long term (current) drug therapy: Z79.899

## 2017-07-24 HISTORY — PX: IRRIGATION AND DEBRIDEMENT FOOT: SHX6602

## 2017-07-24 HISTORY — DX: Nausea with vomiting, unspecified: R11.2

## 2017-07-24 HISTORY — DX: Chronic kidney disease, stage 3 (moderate): N18.3

## 2017-07-24 HISTORY — DX: Obstructive sleep apnea (adult) (pediatric): G47.33

## 2017-07-24 HISTORY — DX: Long term (current) use of unspecified immunomodulators and immunosuppressants: Z79.60

## 2017-07-24 HISTORY — DX: Pancreas transplant status: Z94.83

## 2017-07-24 HISTORY — DX: Other specified postprocedural states: Z98.890

## 2017-07-24 LAB — POCT I-STAT, CHEM 8
BUN: 27 mg/dL — ABNORMAL HIGH (ref 6–20)
CREATININE: 1.6 mg/dL — AB (ref 0.61–1.24)
Calcium, Ion: 1.26 mmol/L (ref 1.15–1.40)
Chloride: 100 mmol/L — ABNORMAL LOW (ref 101–111)
GLUCOSE: 251 mg/dL — AB (ref 65–99)
HCT: 43 % (ref 39.0–52.0)
Hemoglobin: 14.6 g/dL (ref 13.0–17.0)
Potassium: 4.8 mmol/L (ref 3.5–5.1)
Sodium: 140 mmol/L (ref 135–145)
TCO2: 26 mmol/L (ref 22–32)

## 2017-07-24 LAB — GLUCOSE, CAPILLARY: Glucose-Capillary: 138 mg/dL — ABNORMAL HIGH (ref 65–99)

## 2017-07-24 SURGERY — DEBRIDEMENT, WOUND
Anesthesia: Choice | Laterality: Left

## 2017-07-24 SURGERY — IRRIGATION AND DEBRIDEMENT FOOT
Anesthesia: Monitor Anesthesia Care | Site: Foot | Laterality: Left

## 2017-07-24 MED ORDER — MIDAZOLAM HCL 2 MG/2ML IJ SOLN
INTRAMUSCULAR | Status: DC | PRN
  Administered 2017-07-24: 2 mg via INTRAVENOUS

## 2017-07-24 MED ORDER — ACETAMINOPHEN 325 MG PO TABS
650.0000 mg | ORAL_TABLET | ORAL | Status: DC | PRN
  Filled 2017-07-24: qty 2

## 2017-07-24 MED ORDER — LIDOCAINE 2% (20 MG/ML) 5 ML SYRINGE
INTRAMUSCULAR | Status: AC
  Filled 2017-07-24: qty 5

## 2017-07-24 MED ORDER — CEFAZOLIN SODIUM-DEXTROSE 2-4 GM/100ML-% IV SOLN
2.0000 g | INTRAVENOUS | Status: AC
  Administered 2017-07-24: 1 g via INTRAVENOUS
  Filled 2017-07-24: qty 100

## 2017-07-24 MED ORDER — SODIUM CHLORIDE 0.9 % IV SOLN
250.0000 mL | INTRAVENOUS | Status: DC | PRN
  Filled 2017-07-24: qty 250

## 2017-07-24 MED ORDER — BUPIVACAINE HCL 0.5 % IJ SOLN
INTRAMUSCULAR | Status: DC | PRN
  Administered 2017-07-24: 5 mL

## 2017-07-24 MED ORDER — SODIUM CHLORIDE 0.9% FLUSH
3.0000 mL | INTRAVENOUS | Status: DC | PRN
  Filled 2017-07-24: qty 3

## 2017-07-24 MED ORDER — SODIUM CHLORIDE 0.9% FLUSH
3.0000 mL | Freq: Two times a day (BID) | INTRAVENOUS | Status: DC
  Filled 2017-07-24: qty 3

## 2017-07-24 MED ORDER — CEFAZOLIN SODIUM-DEXTROSE 2-4 GM/100ML-% IV SOLN
2.0000 g | INTRAVENOUS | Status: DC
  Filled 2017-07-24: qty 100

## 2017-07-24 MED ORDER — PROPOFOL 10 MG/ML IV BOLUS
INTRAVENOUS | Status: AC
  Filled 2017-07-24: qty 20

## 2017-07-24 MED ORDER — PROMETHAZINE HCL 25 MG/ML IJ SOLN
6.2500 mg | INTRAMUSCULAR | Status: DC | PRN
  Filled 2017-07-24: qty 1

## 2017-07-24 MED ORDER — LIDOCAINE 2% (20 MG/ML) 5 ML SYRINGE
INTRAMUSCULAR | Status: DC | PRN
  Administered 2017-07-24: 40 mg via INTRAVENOUS

## 2017-07-24 MED ORDER — FENTANYL CITRATE (PF) 100 MCG/2ML IJ SOLN
25.0000 ug | INTRAMUSCULAR | Status: DC | PRN
  Filled 2017-07-24: qty 1

## 2017-07-24 MED ORDER — CHLORHEXIDINE GLUCONATE CLOTH 2 % EX PADS
6.0000 | MEDICATED_PAD | Freq: Once | CUTANEOUS | Status: DC
  Filled 2017-07-24: qty 6

## 2017-07-24 MED ORDER — SODIUM CHLORIDE 0.9 % IV SOLN
INTRAVENOUS | Status: DC
  Administered 2017-07-24: 11:00:00 via INTRAVENOUS
  Filled 2017-07-24: qty 1000

## 2017-07-24 MED ORDER — ONDANSETRON HCL 4 MG/2ML IJ SOLN
INTRAMUSCULAR | Status: DC | PRN
  Administered 2017-07-24: 4 mg via INTRAVENOUS

## 2017-07-24 MED ORDER — FENTANYL CITRATE (PF) 100 MCG/2ML IJ SOLN
INTRAMUSCULAR | Status: DC | PRN
  Administered 2017-07-24: 100 ug via INTRAVENOUS

## 2017-07-24 MED ORDER — FENTANYL CITRATE (PF) 100 MCG/2ML IJ SOLN
INTRAMUSCULAR | Status: AC
  Filled 2017-07-24: qty 2

## 2017-07-24 MED ORDER — CEFAZOLIN SODIUM-DEXTROSE 2-4 GM/100ML-% IV SOLN
INTRAVENOUS | Status: AC
  Filled 2017-07-24: qty 100

## 2017-07-24 MED ORDER — ONDANSETRON HCL 4 MG/2ML IJ SOLN
INTRAMUSCULAR | Status: AC
  Filled 2017-07-24: qty 2

## 2017-07-24 MED ORDER — MIDAZOLAM HCL 2 MG/2ML IJ SOLN
INTRAMUSCULAR | Status: AC
  Filled 2017-07-24: qty 2

## 2017-07-24 MED ORDER — SODIUM CHLORIDE 0.9 % IV SOLN
INTRAVENOUS | Status: DC | PRN
  Administered 2017-07-24: 500 mL

## 2017-07-24 MED ORDER — PROPOFOL 10 MG/ML IV BOLUS
INTRAVENOUS | Status: DC | PRN
  Administered 2017-07-24 (×10): 10 mg via INTRAVENOUS
  Administered 2017-07-24 (×2): 20 mg via INTRAVENOUS
  Administered 2017-07-24 (×4): 10 mg via INTRAVENOUS

## 2017-07-24 SURGICAL SUPPLY — 38 items
BANDAGE ELASTIC 4 VELCRO ST LF (GAUZE/BANDAGES/DRESSINGS) ×3 IMPLANT
BLADE SURG 15 STRL LF DISP TIS (BLADE) ×4 IMPLANT
BLADE SURG 15 STRL SS (BLADE) ×8
BNDG CONFORM 3 STRL LF (GAUZE/BANDAGES/DRESSINGS) ×3 IMPLANT
COVER BACK TABLE 60X90IN (DRAPES) ×3 IMPLANT
COVER MAYO STAND STRL (DRAPES) ×3 IMPLANT
DRAPE EXTREMITY T 121X128X90 (DRAPE) ×3 IMPLANT
DRSG ADAPTIC 3X8 NADH LF (GAUZE/BANDAGES/DRESSINGS) ×3 IMPLANT
ELECT REM PT RETURN 9FT ADLT (ELECTROSURGICAL) ×3
ELECTRODE REM PT RTRN 9FT ADLT (ELECTROSURGICAL) ×1 IMPLANT
GAUZE SPONGE 4X4 12PLY STRL (GAUZE/BANDAGES/DRESSINGS) ×3 IMPLANT
GAUZE SPONGE 4X4 16PLY XRAY LF (GAUZE/BANDAGES/DRESSINGS) ×3 IMPLANT
GLOVE BIO SURGEON STRL SZ7.5 (GLOVE) ×6 IMPLANT
GLOVE INDICATOR 7.5 STRL GRN (GLOVE) ×9 IMPLANT
GOWN STRL REUS W/ TWL LRG LVL3 (GOWN DISPOSABLE) ×1 IMPLANT
GOWN STRL REUS W/TWL LRG LVL3 (GOWN DISPOSABLE) ×2
IMPL STRAVIX 3X6 (Tissue) ×1 IMPLANT
IMPLANT STRAVIX 3X6 (Tissue) ×3 IMPLANT
KIT TURNOVER CYSTO (KITS) ×3 IMPLANT
NDL SAFETY ECLIPSE 18X1.5 (NEEDLE) ×1 IMPLANT
NEEDLE HYPO 18GX1.5 SHARP (NEEDLE) ×2
NEEDLE HYPO 22GX1.5 SAFETY (NEEDLE) ×3 IMPLANT
NS IRRIG 500ML POUR BTL (IV SOLUTION) ×6 IMPLANT
PACK BASIN DAY SURGERY FS (CUSTOM PROCEDURE TRAY) ×3 IMPLANT
PADDING CAST ABS 4INX4YD NS (CAST SUPPLIES) ×2
PADDING CAST ABS COTTON 4X4 ST (CAST SUPPLIES) ×1 IMPLANT
PENCIL BUTTON HOLSTER BLD 10FT (ELECTRODE) ×3 IMPLANT
SPONGE LAP 4X18 RFD (DISPOSABLE) ×3 IMPLANT
STOCKINETTE 6  STRL (DRAPES) ×2
STOCKINETTE 6 STRL (DRAPES) ×1 IMPLANT
SUCTION FRAZIER HANDLE 10FR (MISCELLANEOUS) ×2
SUCTION TUBE FRAZIER 10FR DISP (MISCELLANEOUS) ×1 IMPLANT
SUT PROLENE 4 0 PS 2 18 (SUTURE) ×6 IMPLANT
SYR BULB 3OZ (MISCELLANEOUS) ×3 IMPLANT
SYR CONTROL 10ML LL (SYRINGE) ×3 IMPLANT
TUBE CONNECTING 12'X1/4 (SUCTIONS) ×1
TUBE CONNECTING 12X1/4 (SUCTIONS) ×2 IMPLANT
UNDERPAD 30X30 (UNDERPADS AND DIAPERS) ×3 IMPLANT

## 2017-07-24 NOTE — Anesthesia Postprocedure Evaluation (Signed)
Anesthesia Post Note  Patient: Tony Fisher  Procedure(s) Performed: DEBRIDEMENT SUBCUTANEOUS TISSUE OF LEFT FOOT WITH STRAVIX GRAFT (Left Foot)     Patient location during evaluation: PACU Anesthesia Type: MAC Level of consciousness: awake and alert Pain management: pain level controlled Vital Signs Assessment: post-procedure vital signs reviewed and stable Respiratory status: spontaneous breathing, nonlabored ventilation and respiratory function stable Cardiovascular status: stable and blood pressure returned to baseline Postop Assessment: no apparent nausea or vomiting Anesthetic complications: no    Last Vitals:  Vitals:   07/24/17 1400 07/24/17 1415  BP: 140/86   Pulse: 78 79  Resp: 10 14  Temp:    SpO2: 100% 100%    Last Pain:  Vitals:   07/24/17 1408  TempSrc:   PainSc: 0-No pain                 Lynda Rainwater

## 2017-07-24 NOTE — Anesthesia Preprocedure Evaluation (Signed)
Anesthesia Evaluation  Patient identified by MRN, date of birth, ID band Patient awake    Reviewed: Allergy & Precautions, NPO status , Patient's Chart, lab work & pertinent test results  Airway Mallampati: II  TM Distance: >3 FB Neck ROM: Full    Dental no notable dental hx.    Pulmonary sleep apnea ,    Pulmonary exam normal breath sounds clear to auscultation       Cardiovascular + Peripheral Vascular Disease  Normal cardiovascular exam Rhythm:Regular Rate:Normal     Neuro/Psych negative neurological ROS  negative psych ROS   GI/Hepatic negative GI ROS, Neg liver ROS,   Endo/Other  diabetes, Type 1  Renal/GU Renal disease  negative genitourinary   Musculoskeletal negative musculoskeletal ROS (+)   Abdominal   Peds negative pediatric ROS (+)  Hematology negative hematology ROS (+)   Anesthesia Other Findings   Reproductive/Obstetrics negative OB ROS                             Anesthesia Physical Anesthesia Plan  ASA: III  Anesthesia Plan: MAC   Post-op Pain Management:    Induction: Intravenous  PONV Risk Score and Plan: 1 and Ondansetron and Treatment may vary due to age or medical condition  Airway Management Planned: Simple Face Mask  Additional Equipment:   Intra-op Plan:   Post-operative Plan:   Informed Consent: I have reviewed the patients History and Physical, chart, labs and discussed the procedure including the risks, benefits and alternatives for the proposed anesthesia with the patient or authorized representative who has indicated his/her understanding and acceptance.   Dental advisory given  Plan Discussed with: CRNA and Surgeon  Anesthesia Plan Comments:         Anesthesia Quick Evaluation

## 2017-07-24 NOTE — Transfer of Care (Signed)
Immediate Anesthesia Transfer of Care Note  Patient: Tony Fisher  Procedure(s) Performed: DEBRIDEMENT SUBCUTANEOUS TISSUE OF LEFT FOOT WITH STRAVIX GRAFT (Left Foot)  Patient Location: PACU  Anesthesia Type:MAC  Level of Consciousness: awake, alert , oriented and patient cooperative  Airway & Oxygen Therapy: Patient Spontanous Breathing and Patient connected to nasal cannula oxygen  Post-op Assessment: Report given to RN and Post -op Vital signs reviewed and stable  Post vital signs: Reviewed and stable  Last Vitals:  Vitals Value Taken Time  BP 121/85 07/24/2017  1:22 PM  Temp    Pulse 84 07/24/2017  1:26 PM  Resp 8 07/24/2017  1:26 PM  SpO2 100 % 07/24/2017  1:26 PM  Vitals shown include unvalidated device data.  Last Pain:  Vitals:   07/24/17 1031  TempSrc:   PainSc: 0-No pain      Patients Stated Pain Goal: 7 (09/38/18 2993)  Complications: No apparent anesthesia complications

## 2017-07-24 NOTE — Discharge Instructions (Signed)
Continue nonweightbearing Wear boot Keep dressing clean and dry-reinforce as needed  Call your surgeon if you experience:   1.  Fever over 101.0. 2.  Nausea and/or vomiting. 3.  Extreme swelling or bruising at the surgical site. 4.  Continued bleeding from the incision. 5.  Increased pain, redness or drainage from the incision. 6.  Any problems and/or concerns  Post Anesthesia Home Care Instructions  Activity: Get plenty of rest for the remainder of the day. A responsible individual must stay with you for 24 hours following the procedure.  For the next 24 hours, DO NOT: -Drive a car -Paediatric nurse -Drink alcoholic beverages -Take any medication unless instructed by your physician -Make any legal decisions or sign important papers.  Meals: Start with liquid foods such as gelatin or soup. Progress to regular foods as tolerated. Avoid greasy, spicy, heavy foods. If nausea and/or vomiting occur, drink only clear liquids until the nausea and/or vomiting subsides. Call your physician if vomiting continues.  Special Instructions/Symptoms: Your throat may feel dry or sore from the anesthesia or the breathing tube placed in your throat during surgery. If this causes discomfort, gargle with warm salt water. The discomfort should disappear within 24 hours.  If you had a scopolamine patch placed behind your ear for the management of post- operative nausea and/or vomiting:  1. The medication in the patch is effective for 72 hours, after which it should be removed.  Wrap patch in a tissue and discard in the trash. Wash hands thoroughly with soap and water. 2. You may remove the patch earlier than 72 hours if you experience unpleasant side effects which may include dry mouth, dizziness or visual disturbances. 3. Avoid touching the patch. Wash your hands with soap and water after contact with the patch.     Call your surgeon if you experience:   1.  Fever over 101.0. 2.  Inability to  urinate. 3.  Nausea and/or vomiting. 4.  Extreme swelling or bruising at the surgical site. 5.  Continued bleeding from the incision. 6.  Increased pain, redness or drainage from the incision. 7.  Problems related to your pain medication. 8.  Any problems and/or concerns

## 2017-07-25 ENCOUNTER — Encounter (HOSPITAL_BASED_OUTPATIENT_CLINIC_OR_DEPARTMENT_OTHER): Payer: Self-pay | Admitting: Podiatry

## 2017-07-27 DIAGNOSIS — E11621 Type 2 diabetes mellitus with foot ulcer: Secondary | ICD-10-CM | POA: Diagnosis not present

## 2017-07-27 DIAGNOSIS — L97522 Non-pressure chronic ulcer of other part of left foot with fat layer exposed: Secondary | ICD-10-CM | POA: Diagnosis not present

## 2017-07-29 LAB — AEROBIC/ANAEROBIC CULTURE (SURGICAL/DEEP WOUND)

## 2017-07-29 LAB — AEROBIC/ANAEROBIC CULTURE W GRAM STAIN (SURGICAL/DEEP WOUND)

## 2017-07-30 NOTE — Op Note (Signed)
NAMERIVER, AMBROSIO MEDICAL RECORD [id number] ACCOUNT [id number] DATE OF BIRTH:November 26, 1970 FACILITY: [facility] LOCATION: [facility] PHYSICIAN:Herberta Pickron, DPM  OPERATIVE REPORT  DATE OF PROCEDURE:  07/24/2017  SURGEON:  Rosemary Holms, DPM  ASSISTANT:  None.  PREOPERATIVE DIAGNOSIS:  Chronic ulceration of the heel, left foot.  POSTOPERATIVE DIAGNOSIS:  Chronic ulceration of the heel, left foot.  PROCEDURE:  Preparation of left wound bed and application of Stravix graft.  ANESTHESIA:  Sedation.  PROCEDURE IN DETAIL:  The patient was brought to the OR and placed in the supine position, at which time monitored anesthesia care was administered.  The patient was prepped and draped in the usual aseptic manner.  Attention was directed to the left heel  where photographs were taken and measurements were taken and the wound was measured around 3.5 cm in length and 3.1 cm in width.  The patient has a history of a chronic wound secondary to diabetes and has failed to respond to good standard wound care  for several months, which has included debridement to the wound bed and advanced dressings, collagen, Santyl, and Grafix.  Therefore, use the advanced therapies indicated at this time.  Underlying disease is being treated by a licensed physician.  He is  also having a revascularization on July 1 by Dr. Donzetta Matters.  The patient is being offloaded for the diabetic foot ulcer.  No signs of infection or abscesses were noted, so there is absence of acute wound infection and absence of osteomyelitis.  The patient is  adequately nourished for wound healing.  DESCRIPTION OF PROCEDURE:  The patient presents for a sterile application of a 3 x 6 cm piece of Stravix.  The foot was prepped, and attention was directed to the wound site.  The wound site was aggressively surgically debrided and the wound bed excised  with a 15 blade to prepare the wound site for the graft tissue.  All necrotic and nonviable tissue  was passed from the procedural field.  Care was taken to assure the best wound bed for the application of the graft.  No sign of infection or abscess was  noted.  The wound site was flushed with pulse lavage with copious amounts of sterile saline and antibiotic solution.  The sterile graft material was removed from its package and placed in saline, and then it was carefully applied to the wound base using  forceps.  At this point, great care was to make sure the graft was in full contact with the wound base, and 4-0 Prolene was used to suture the graft around the perimeter.  No seroma or hematoma was noted with the application.  The Prolene was used to  anchor the graft and secure in position, and complete contact was achieved.  Then, Adaptic was applied and placed over the graft to maintain moisture and prevent infection.  Fluffs 4 x 4's, Kling and an Ace bandage were applied.  The patient was then  sent to the recovery room with vital signs stable and given thorough postoperative instructions, which include absolute nonweightbearing, keeping dressing dry and intact, and he is following up in 4 days.  LN/NUANCE  D:07/30/2017 T:07/30/2017 JOB:[id number]

## 2017-07-31 ENCOUNTER — Encounter (HOSPITAL_COMMUNITY): Payer: Self-pay | Admitting: Vascular Surgery

## 2017-07-31 ENCOUNTER — Encounter (HOSPITAL_COMMUNITY)
Admission: RE | Disposition: A | Discharge status: Home / Self Care | Payer: Self-pay | Source: Ambulatory Visit | Attending: Vascular Surgery

## 2017-07-31 ENCOUNTER — Ambulatory Visit (HOSPITAL_COMMUNITY)
Admission: RE | Admit: 2017-07-31 | Discharge: 2017-07-31 | Disposition: A | Discharge status: Home / Self Care | Payer: Medicare Other | Source: Ambulatory Visit | Attending: Vascular Surgery | Admitting: Vascular Surgery

## 2017-07-31 DIAGNOSIS — I998 Other disorder of circulatory system: Secondary | ICD-10-CM | POA: Diagnosis not present

## 2017-07-31 DIAGNOSIS — I120 Hypertensive chronic kidney disease with stage 5 chronic kidney disease or end stage renal disease: Secondary | ICD-10-CM | POA: Insufficient documentation

## 2017-07-31 DIAGNOSIS — N186 End stage renal disease: Secondary | ICD-10-CM | POA: Insufficient documentation

## 2017-07-31 DIAGNOSIS — E11621 Type 2 diabetes mellitus with foot ulcer: Secondary | ICD-10-CM | POA: Insufficient documentation

## 2017-07-31 DIAGNOSIS — I70244 Atherosclerosis of native arteries of left leg with ulceration of heel and midfoot: Secondary | ICD-10-CM | POA: Diagnosis not present

## 2017-07-31 DIAGNOSIS — Z8249 Family history of ischemic heart disease and other diseases of the circulatory system: Secondary | ICD-10-CM | POA: Insufficient documentation

## 2017-07-31 DIAGNOSIS — I739 Peripheral vascular disease, unspecified: Secondary | ICD-10-CM | POA: Diagnosis present

## 2017-07-31 DIAGNOSIS — Z940 Kidney transplant status: Secondary | ICD-10-CM | POA: Diagnosis not present

## 2017-07-31 DIAGNOSIS — Z992 Dependence on renal dialysis: Secondary | ICD-10-CM | POA: Insufficient documentation

## 2017-07-31 DIAGNOSIS — E1151 Type 2 diabetes mellitus with diabetic peripheral angiopathy without gangrene: Secondary | ICD-10-CM | POA: Insufficient documentation

## 2017-07-31 DIAGNOSIS — Z89511 Acquired absence of right leg below knee: Secondary | ICD-10-CM | POA: Diagnosis not present

## 2017-07-31 DIAGNOSIS — L97429 Non-pressure chronic ulcer of left heel and midfoot with unspecified severity: Secondary | ICD-10-CM | POA: Diagnosis not present

## 2017-07-31 DIAGNOSIS — E1122 Type 2 diabetes mellitus with diabetic chronic kidney disease: Secondary | ICD-10-CM | POA: Diagnosis not present

## 2017-07-31 HISTORY — PX: LOWER EXTREMITY ANGIOGRAPHY: CATH118251

## 2017-07-31 LAB — POCT I-STAT, CHEM 8
BUN: 38 mg/dL — ABNORMAL HIGH (ref 6–20)
CALCIUM ION: 1.16 mmol/L (ref 1.15–1.40)
CREATININE: 1.7 mg/dL — AB (ref 0.61–1.24)
Chloride: 102 mmol/L (ref 98–111)
GLUCOSE: 173 mg/dL — AB (ref 70–99)
HCT: 44 % (ref 39.0–52.0)
HEMOGLOBIN: 15 g/dL (ref 13.0–17.0)
Potassium: 4.6 mmol/L (ref 3.5–5.1)
Sodium: 138 mmol/L (ref 135–145)
TCO2: 27 mmol/L (ref 22–32)

## 2017-07-31 SURGERY — LOWER EXTREMITY ANGIOGRAPHY
Anesthesia: LOCAL | Laterality: Left

## 2017-07-31 MED ORDER — ACETAMINOPHEN 325 MG PO TABS: 650.0000 mg | ORAL_TABLET | ORAL | Status: DC | PRN

## 2017-07-31 MED ORDER — MIDAZOLAM HCL 2 MG/2ML IJ SOLN
INTRAMUSCULAR | Status: DC | PRN
  Administered 2017-07-31 (×4): 1 mg via INTRAVENOUS

## 2017-07-31 MED ORDER — LIDOCAINE HCL (PF) 1 % IJ SOLN
INTRAMUSCULAR | Status: DC | PRN
  Administered 2017-07-31: 15 mL

## 2017-07-31 MED ORDER — FENTANYL CITRATE (PF) 100 MCG/2ML IJ SOLN
INTRAMUSCULAR | Status: AC
  Filled 2017-07-31: qty 2

## 2017-07-31 MED ORDER — LIDOCAINE HCL (PF) 1 % IJ SOLN
INTRAMUSCULAR | Status: AC
  Filled 2017-07-31: qty 30

## 2017-07-31 MED ORDER — HEPARIN (PORCINE) IN NACL 1000-0.9 UT/500ML-% IV SOLN
INTRAVENOUS | Status: AC
  Filled 2017-07-31: qty 1000

## 2017-07-31 MED ORDER — HYDRALAZINE HCL 20 MG/ML IJ SOLN: 5.0000 mg | INTRAMUSCULAR | Status: DC | PRN

## 2017-07-31 MED ORDER — MORPHINE SULFATE (PF) 10 MG/ML IV SOLN: 2.0000 mg | INTRAVENOUS | Status: DC | PRN

## 2017-07-31 MED ORDER — OXYCODONE HCL 5 MG PO TABS: 5.0000 mg | ORAL_TABLET | ORAL | Status: DC | PRN

## 2017-07-31 MED ORDER — FENTANYL CITRATE (PF) 100 MCG/2ML IJ SOLN
INTRAMUSCULAR | Status: DC | PRN
  Administered 2017-07-31: 100 ug via INTRAVENOUS
  Administered 2017-07-31: 25 ug via INTRAVENOUS

## 2017-07-31 MED ORDER — MIDAZOLAM HCL 2 MG/2ML IJ SOLN
INTRAMUSCULAR | Status: AC
  Filled 2017-07-31: qty 2

## 2017-07-31 MED ORDER — SODIUM CHLORIDE 0.9 % IV SOLN
INTRAVENOUS | Status: DC
  Administered 2017-07-31: 07:00:00 via INTRAVENOUS

## 2017-07-31 MED ORDER — IODIXANOL 320 MG/ML IV SOLN
INTRAVENOUS | Status: DC | PRN
  Administered 2017-07-31: 10 mL via INTRA_ARTERIAL

## 2017-07-31 MED ORDER — SODIUM CHLORIDE 0.9 % IV SOLN: INTRAVENOUS | Status: DC

## 2017-07-31 MED ORDER — HEPARIN (PORCINE) IN NACL 2-0.9 UNITS/ML
INTRAMUSCULAR | Status: AC | PRN
  Administered 2017-07-31: 500 mL

## 2017-07-31 MED ORDER — SODIUM CHLORIDE 0.9 % IV SOLN: 250.0000 mL | INTRAVENOUS | Status: DC | PRN

## 2017-07-31 MED ORDER — ONDANSETRON HCL 4 MG/2ML IJ SOLN
4.0000 mg | Freq: Four times a day (QID) | INTRAMUSCULAR | Status: DC | PRN
Start: 2017-07-31 — End: 2017-07-31

## 2017-07-31 MED ORDER — SODIUM CHLORIDE 0.9% FLUSH: 3.0000 mL | Freq: Two times a day (BID) | INTRAVENOUS | Status: DC

## 2017-07-31 MED ORDER — SODIUM CHLORIDE 0.9% FLUSH: 3.0000 mL | INTRAVENOUS | Status: DC | PRN

## 2017-07-31 MED ORDER — HEPARIN SODIUM (PORCINE) 1000 UNIT/ML IJ SOLN
INTRAMUSCULAR | Status: DC | PRN
  Administered 2017-07-31: 7000 [IU] via INTRAVENOUS

## 2017-07-31 MED ORDER — LABETALOL HCL 5 MG/ML IV SOLN: 10.0000 mg | INTRAVENOUS | Status: DC | PRN

## 2017-07-31 SURGICAL SUPPLY — 21 items
CATH OMNI FLUSH 5F 65CM (CATHETERS) ×2 IMPLANT
CATH QUICKCROSS ANG SELECT (CATHETERS) ×2 IMPLANT
CATH SOFT-VU ST 4F 90CM (CATHETERS) ×2 IMPLANT
COVER PRB 48X5XTLSCP FOLD TPE (BAG) ×1 IMPLANT
COVER PROBE 5X48 (BAG) ×1
DEVICE CLOSURE MYNXGRIP 6/7F (Vascular Products) ×2 IMPLANT
FILTER CO2 0.2 MICRON (VASCULAR PRODUCTS) ×2 IMPLANT
KIT MICROPUNCTURE NIT STIFF (SHEATH) ×2 IMPLANT
KIT PV (KITS) ×2 IMPLANT
RESERVOIR CO2 (VASCULAR PRODUCTS) ×2 IMPLANT
SET FLUSH CO2 (MISCELLANEOUS) ×2 IMPLANT
SHEATH PINNACLE 5F 10CM (SHEATH) ×2 IMPLANT
SHEATH PINNACLE 6F 10CM (SHEATH) ×2 IMPLANT
SHEATH SHUTTLE SELECT 6F (SHEATH) ×2 IMPLANT
SHIELD RADPAD SCOOP 12X17 (MISCELLANEOUS) ×2 IMPLANT
SYR MEDRAD MARK V 150ML (SYRINGE) ×2 IMPLANT
TRANSDUCER W/STOPCOCK (MISCELLANEOUS) ×2 IMPLANT
TRAY PV CATH (CUSTOM PROCEDURE TRAY) ×2 IMPLANT
WIRE BENTSON .035X145CM (WIRE) ×2 IMPLANT
WIRE G V18X300CM (WIRE) ×2 IMPLANT
WIRE ROSEN-J .035X260CM (WIRE) ×2 IMPLANT

## 2017-07-31 NOTE — H&P (Addendum)
   History and Physical Update  The patient was interviewed and re-examined.  The patient's previous History and Physical has been reviewed and is unchanged recent office visit.  Plan for left lower extremity angiogram with CO2 possible retrograde access.  Teigen Bellin C. Donzetta Matters, MD [facility] [place] Office: [phone] Pager: [phone]   07/31/2017, 7:29 AM

## 2017-07-31 NOTE — Op Note (Signed)
    Patient name: Tony Fisher MRN: [id number] DOB: 10/25/1970 Sex: male  07/31/2017 Pre-operative Diagnosis: Critical left lower extremity ischemia, history of renal transplant Post-operative diagnosis:  Same Surgeon:  Eda Paschal. Donzetta Matters, MD Procedure Performed: 1.  Ultrasound-guided cannulation right common femoral artery 2.  CO2 aortogram and left lower extremity angiogram 3.  Attempted angioplasty of left posterior tibial artery 4.  Moderate sedation with fentanyl and Versed for 60 minutes 5.  Minx device closure of right common femoral artery   Indications: 47 year old male with a history of a right lower extremity below-knee amputation now has a left lower extremity heel ulceration has undergone skin grafting.  He has a known patency of an anterior tibial artery but no flow posteriorly could be seen on duplex.  He is indicated for angiogram given his history of renal transplant we will use CO2.  Findings: There is patency of the aorta and iliac segments as well as the femoral SFA popliteal to the level of the trifurcation.  His dominant vessel to his foot this is anterior tibial but only fills the anterior aspect of the leg.  Peroneal also runs the level of the ankle but does not appear to have discernible branch points from there.  The takeoff of the posterior tibial artery is patent.  I used ultrasound and demonstrated a posterior tibial at the level of the ankle could not discern any flow in it.  I then attempted to cross through the posterior tibial was able to stay intraluminal but angiogram demonstrated only small collaterals to the ankle.  With this I aborted the procedure.   Procedure:  The patient was identified in the holding area and taken to room 8.  The patient was then placed supine on the table and prepped and draped in the usual sterile fashion.  A time out was called.  Ultrasound was used to evaluate the right common femoral artery and it was noted to be patent.  1% lidocaine  was used to anesthetize the area.  Use ultrasound to directly cannulate with a micropuncture needle and an image was saved to the permanent record.  I placed a micropuncture sheath followed by Bentson wire short 5 French sheath and Omni catheter.  We performed CO2 aortogram and then crossed the bifurcation perform left lower extremity angiogram with CO2.  I then placed a Rosen wire to the level of the knee and a straight catheter to there.  I used limited contrast identified the takeoff of PT and what I thought was possibly a reconstitution of PT at the ankle.  With this I placed a long 6 French sheath and the patient was heparinized.  I then used a quick cross select catheter as well as a V 18 wire to select the posterior tibial artery was able to get down to nearly the ankle but angiogram demonstrated that I was in collateralization and there was no reconstitution of the posterior tibial.  With this I elected to remove the catheter wire.  I exchanged for a short 6 French sheath and deployed a minx device which did deploy well.  He tolerated this procedure well without immediate complication.  There are no revascularization options for the posterior aspect including the heel for this patient.  Contrast: 10 cc.   Pat Sires C. Donzetta Matters, MD [facility] [place] Office: [phone] Pager: [phone]

## 2017-07-31 NOTE — Discharge Instructions (Signed)

## 2017-08-04 DIAGNOSIS — L97522 Non-pressure chronic ulcer of other part of left foot with fat layer exposed: Secondary | ICD-10-CM | POA: Diagnosis not present

## 2017-08-04 DIAGNOSIS — E11621 Type 2 diabetes mellitus with foot ulcer: Secondary | ICD-10-CM | POA: Diagnosis not present

## 2017-08-07 ENCOUNTER — Telehealth: Payer: Self-pay | Admitting: *Deleted

## 2017-08-07 NOTE — Telephone Encounter (Signed)
Spoke with patient briefly about the [facility] Research study. Questions encouraged and answered. Patient seems interested and agreed for for an ICF to be emailed. I will follow up with patient in a few days if not sooner.

## 2017-08-10 DIAGNOSIS — E11621 Type 2 diabetes mellitus with foot ulcer: Secondary | ICD-10-CM | POA: Diagnosis not present

## 2017-08-10 DIAGNOSIS — L97522 Non-pressure chronic ulcer of other part of left foot with fat layer exposed: Secondary | ICD-10-CM | POA: Diagnosis not present

## 2017-08-15 ENCOUNTER — Telehealth: Payer: Self-pay | Admitting: *Deleted

## 2017-08-15 NOTE — Telephone Encounter (Signed)
[facility] study follow up: Received message from patient about participating in research study. Patients podiatrist raised the question if he consents to study would let excluded him from future skin grafting to heel of same leg. I sent Study PI email and left patient message. I will follow up once I receive information.

## 2017-08-17 DIAGNOSIS — L97522 Non-pressure chronic ulcer of other part of left foot with fat layer exposed: Secondary | ICD-10-CM | POA: Diagnosis not present

## 2017-08-17 DIAGNOSIS — E11621 Type 2 diabetes mellitus with foot ulcer: Secondary | ICD-10-CM | POA: Diagnosis not present

## 2017-08-24 DIAGNOSIS — L97522 Non-pressure chronic ulcer of other part of left foot with fat layer exposed: Secondary | ICD-10-CM | POA: Diagnosis not present

## 2017-08-24 DIAGNOSIS — E11621 Type 2 diabetes mellitus with foot ulcer: Secondary | ICD-10-CM | POA: Diagnosis not present

## 2017-09-05 DIAGNOSIS — E11319 Type 2 diabetes mellitus with unspecified diabetic retinopathy without macular edema: Secondary | ICD-10-CM | POA: Diagnosis not present

## 2017-09-05 DIAGNOSIS — E1021 Type 1 diabetes mellitus with diabetic nephropathy: Secondary | ICD-10-CM | POA: Diagnosis not present

## 2017-09-05 DIAGNOSIS — Z9483 Pancreas transplant status: Secondary | ICD-10-CM | POA: Diagnosis not present

## 2017-09-05 DIAGNOSIS — N2581 Secondary hyperparathyroidism of renal origin: Secondary | ICD-10-CM | POA: Diagnosis not present

## 2017-09-05 DIAGNOSIS — D631 Anemia in chronic kidney disease: Secondary | ICD-10-CM | POA: Diagnosis not present

## 2017-09-05 DIAGNOSIS — Z9225 Personal history of immunosupression therapy: Secondary | ICD-10-CM | POA: Diagnosis not present

## 2017-09-05 DIAGNOSIS — I739 Peripheral vascular disease, unspecified: Secondary | ICD-10-CM | POA: Diagnosis not present

## 2017-09-05 DIAGNOSIS — I120 Hypertensive chronic kidney disease with stage 5 chronic kidney disease or end stage renal disease: Secondary | ICD-10-CM | POA: Diagnosis not present

## 2017-09-05 DIAGNOSIS — E1122 Type 2 diabetes mellitus with diabetic chronic kidney disease: Secondary | ICD-10-CM | POA: Diagnosis not present

## 2017-09-05 DIAGNOSIS — Z940 Kidney transplant status: Secondary | ICD-10-CM | POA: Diagnosis not present

## 2017-09-05 DIAGNOSIS — K219 Gastro-esophageal reflux disease without esophagitis: Secondary | ICD-10-CM | POA: Diagnosis not present

## 2017-09-05 DIAGNOSIS — N185 Chronic kidney disease, stage 5: Secondary | ICD-10-CM | POA: Diagnosis not present

## 2017-09-05 DIAGNOSIS — E10621 Type 1 diabetes mellitus with foot ulcer: Secondary | ICD-10-CM | POA: Diagnosis not present

## 2017-09-07 DIAGNOSIS — E11621 Type 2 diabetes mellitus with foot ulcer: Secondary | ICD-10-CM | POA: Diagnosis not present

## 2017-09-07 DIAGNOSIS — L97522 Non-pressure chronic ulcer of other part of left foot with fat layer exposed: Secondary | ICD-10-CM | POA: Diagnosis not present

## 2017-09-14 DIAGNOSIS — Z89519 Acquired absence of unspecified leg below knee: Secondary | ICD-10-CM | POA: Diagnosis not present

## 2017-09-14 DIAGNOSIS — E1129 Type 2 diabetes mellitus with other diabetic kidney complication: Secondary | ICD-10-CM | POA: Diagnosis not present

## 2017-09-14 DIAGNOSIS — R2689 Other abnormalities of gait and mobility: Secondary | ICD-10-CM | POA: Diagnosis not present

## 2017-09-14 DIAGNOSIS — E11621 Type 2 diabetes mellitus with foot ulcer: Secondary | ICD-10-CM | POA: Diagnosis not present

## 2017-09-21 DIAGNOSIS — E11621 Type 2 diabetes mellitus with foot ulcer: Secondary | ICD-10-CM | POA: Diagnosis not present

## 2017-09-21 DIAGNOSIS — R2689 Other abnormalities of gait and mobility: Secondary | ICD-10-CM | POA: Diagnosis not present

## 2017-09-21 DIAGNOSIS — E1129 Type 2 diabetes mellitus with other diabetic kidney complication: Secondary | ICD-10-CM | POA: Diagnosis not present

## 2017-09-21 DIAGNOSIS — Z89519 Acquired absence of unspecified leg below knee: Secondary | ICD-10-CM | POA: Diagnosis not present

## 2017-09-28 DIAGNOSIS — R2689 Other abnormalities of gait and mobility: Secondary | ICD-10-CM | POA: Diagnosis not present

## 2017-09-28 DIAGNOSIS — Z89519 Acquired absence of unspecified leg below knee: Secondary | ICD-10-CM | POA: Diagnosis not present

## 2017-09-28 DIAGNOSIS — E1129 Type 2 diabetes mellitus with other diabetic kidney complication: Secondary | ICD-10-CM | POA: Diagnosis not present

## 2017-09-28 DIAGNOSIS — E11621 Type 2 diabetes mellitus with foot ulcer: Secondary | ICD-10-CM | POA: Diagnosis not present

## 2017-10-05 DIAGNOSIS — R2689 Other abnormalities of gait and mobility: Secondary | ICD-10-CM | POA: Diagnosis not present

## 2017-10-05 DIAGNOSIS — Z89519 Acquired absence of unspecified leg below knee: Secondary | ICD-10-CM | POA: Diagnosis not present

## 2017-10-05 DIAGNOSIS — E11621 Type 2 diabetes mellitus with foot ulcer: Secondary | ICD-10-CM | POA: Diagnosis not present

## 2017-10-05 DIAGNOSIS — L97522 Non-pressure chronic ulcer of other part of left foot with fat layer exposed: Secondary | ICD-10-CM | POA: Diagnosis not present

## 2017-10-12 DIAGNOSIS — E11621 Type 2 diabetes mellitus with foot ulcer: Secondary | ICD-10-CM | POA: Diagnosis not present

## 2017-10-12 DIAGNOSIS — L97522 Non-pressure chronic ulcer of other part of left foot with fat layer exposed: Secondary | ICD-10-CM | POA: Diagnosis not present

## 2017-10-19 DIAGNOSIS — L97522 Non-pressure chronic ulcer of other part of left foot with fat layer exposed: Secondary | ICD-10-CM | POA: Diagnosis not present

## 2017-10-19 DIAGNOSIS — E11621 Type 2 diabetes mellitus with foot ulcer: Secondary | ICD-10-CM | POA: Diagnosis not present

## 2017-10-26 DIAGNOSIS — E11621 Type 2 diabetes mellitus with foot ulcer: Secondary | ICD-10-CM | POA: Diagnosis not present

## 2017-10-26 DIAGNOSIS — L97522 Non-pressure chronic ulcer of other part of left foot with fat layer exposed: Secondary | ICD-10-CM | POA: Diagnosis not present

## 2017-11-02 DIAGNOSIS — E11621 Type 2 diabetes mellitus with foot ulcer: Secondary | ICD-10-CM | POA: Diagnosis not present

## 2017-11-02 DIAGNOSIS — L97522 Non-pressure chronic ulcer of other part of left foot with fat layer exposed: Secondary | ICD-10-CM | POA: Diagnosis not present

## 2017-11-09 DIAGNOSIS — E11621 Type 2 diabetes mellitus with foot ulcer: Secondary | ICD-10-CM | POA: Diagnosis not present

## 2017-11-09 DIAGNOSIS — Z89519 Acquired absence of unspecified leg below knee: Secondary | ICD-10-CM | POA: Diagnosis not present

## 2017-11-09 DIAGNOSIS — L97522 Non-pressure chronic ulcer of other part of left foot with fat layer exposed: Secondary | ICD-10-CM | POA: Diagnosis not present

## 2017-11-09 DIAGNOSIS — R2689 Other abnormalities of gait and mobility: Secondary | ICD-10-CM | POA: Diagnosis not present

## 2017-11-16 DIAGNOSIS — L97522 Non-pressure chronic ulcer of other part of left foot with fat layer exposed: Secondary | ICD-10-CM | POA: Diagnosis not present

## 2017-11-16 DIAGNOSIS — R2689 Other abnormalities of gait and mobility: Secondary | ICD-10-CM | POA: Diagnosis not present

## 2017-11-16 DIAGNOSIS — Z89519 Acquired absence of unspecified leg below knee: Secondary | ICD-10-CM | POA: Diagnosis not present

## 2017-11-16 DIAGNOSIS — E11621 Type 2 diabetes mellitus with foot ulcer: Secondary | ICD-10-CM | POA: Diagnosis not present

## 2017-11-23 DIAGNOSIS — E11621 Type 2 diabetes mellitus with foot ulcer: Secondary | ICD-10-CM | POA: Diagnosis not present

## 2017-11-23 DIAGNOSIS — L97522 Non-pressure chronic ulcer of other part of left foot with fat layer exposed: Secondary | ICD-10-CM | POA: Diagnosis not present

## 2017-11-29 DIAGNOSIS — Z89519 Acquired absence of unspecified leg below knee: Secondary | ICD-10-CM | POA: Diagnosis not present

## 2017-11-29 DIAGNOSIS — E11621 Type 2 diabetes mellitus with foot ulcer: Secondary | ICD-10-CM | POA: Diagnosis not present

## 2017-11-29 DIAGNOSIS — L97522 Non-pressure chronic ulcer of other part of left foot with fat layer exposed: Secondary | ICD-10-CM | POA: Diagnosis not present

## 2017-12-05 DIAGNOSIS — I739 Peripheral vascular disease, unspecified: Secondary | ICD-10-CM | POA: Diagnosis not present

## 2017-12-05 DIAGNOSIS — E1122 Type 2 diabetes mellitus with diabetic chronic kidney disease: Secondary | ICD-10-CM | POA: Diagnosis not present

## 2017-12-05 DIAGNOSIS — Z9483 Pancreas transplant status: Secondary | ICD-10-CM | POA: Diagnosis not present

## 2017-12-05 DIAGNOSIS — N2581 Secondary hyperparathyroidism of renal origin: Secondary | ICD-10-CM | POA: Diagnosis not present

## 2017-12-05 DIAGNOSIS — E10621 Type 1 diabetes mellitus with foot ulcer: Secondary | ICD-10-CM | POA: Diagnosis not present

## 2017-12-05 DIAGNOSIS — I129 Hypertensive chronic kidney disease with stage 1 through stage 4 chronic kidney disease, or unspecified chronic kidney disease: Secondary | ICD-10-CM | POA: Diagnosis not present

## 2017-12-05 DIAGNOSIS — E11319 Type 2 diabetes mellitus with unspecified diabetic retinopathy without macular edema: Secondary | ICD-10-CM | POA: Diagnosis not present

## 2017-12-05 DIAGNOSIS — N189 Chronic kidney disease, unspecified: Secondary | ICD-10-CM | POA: Diagnosis not present

## 2017-12-05 DIAGNOSIS — Z9225 Personal history of immunosupression therapy: Secondary | ICD-10-CM | POA: Diagnosis not present

## 2017-12-05 DIAGNOSIS — L97519 Non-pressure chronic ulcer of other part of right foot with unspecified severity: Secondary | ICD-10-CM | POA: Diagnosis not present

## 2017-12-05 DIAGNOSIS — Z940 Kidney transplant status: Secondary | ICD-10-CM | POA: Diagnosis not present

## 2017-12-05 DIAGNOSIS — D631 Anemia in chronic kidney disease: Secondary | ICD-10-CM | POA: Diagnosis not present

## 2017-12-12 DIAGNOSIS — E1021 Type 1 diabetes mellitus with diabetic nephropathy: Secondary | ICD-10-CM | POA: Diagnosis not present

## 2017-12-12 DIAGNOSIS — Z89511 Acquired absence of right leg below knee: Secondary | ICD-10-CM | POA: Diagnosis not present

## 2017-12-14 DIAGNOSIS — L97522 Non-pressure chronic ulcer of other part of left foot with fat layer exposed: Secondary | ICD-10-CM | POA: Diagnosis not present

## 2017-12-14 DIAGNOSIS — B351 Tinea unguium: Secondary | ICD-10-CM | POA: Diagnosis not present

## 2017-12-14 DIAGNOSIS — M79672 Pain in left foot: Secondary | ICD-10-CM | POA: Diagnosis not present

## 2017-12-14 DIAGNOSIS — E11621 Type 2 diabetes mellitus with foot ulcer: Secondary | ICD-10-CM | POA: Diagnosis not present

## 2017-12-21 DIAGNOSIS — L97522 Non-pressure chronic ulcer of other part of left foot with fat layer exposed: Secondary | ICD-10-CM | POA: Diagnosis not present

## 2017-12-21 DIAGNOSIS — E11621 Type 2 diabetes mellitus with foot ulcer: Secondary | ICD-10-CM | POA: Diagnosis not present

## 2018-01-04 DIAGNOSIS — L97522 Non-pressure chronic ulcer of other part of left foot with fat layer exposed: Secondary | ICD-10-CM | POA: Diagnosis not present

## 2018-01-04 DIAGNOSIS — E11621 Type 2 diabetes mellitus with foot ulcer: Secondary | ICD-10-CM | POA: Diagnosis not present

## 2018-01-10 DIAGNOSIS — E11621 Type 2 diabetes mellitus with foot ulcer: Secondary | ICD-10-CM | POA: Diagnosis not present

## 2018-01-10 DIAGNOSIS — L97522 Non-pressure chronic ulcer of other part of left foot with fat layer exposed: Secondary | ICD-10-CM | POA: Diagnosis not present

## 2018-01-18 DIAGNOSIS — E11621 Type 2 diabetes mellitus with foot ulcer: Secondary | ICD-10-CM | POA: Diagnosis not present

## 2018-01-18 DIAGNOSIS — L97522 Non-pressure chronic ulcer of other part of left foot with fat layer exposed: Secondary | ICD-10-CM | POA: Diagnosis not present

## 2018-01-25 DIAGNOSIS — R2689 Other abnormalities of gait and mobility: Secondary | ICD-10-CM | POA: Diagnosis not present

## 2018-01-25 DIAGNOSIS — Z89519 Acquired absence of unspecified leg below knee: Secondary | ICD-10-CM | POA: Diagnosis not present

## 2018-01-25 DIAGNOSIS — E11621 Type 2 diabetes mellitus with foot ulcer: Secondary | ICD-10-CM | POA: Diagnosis not present

## 2018-01-25 DIAGNOSIS — L97522 Non-pressure chronic ulcer of other part of left foot with fat layer exposed: Secondary | ICD-10-CM | POA: Diagnosis not present

## 2018-02-01 DIAGNOSIS — E11621 Type 2 diabetes mellitus with foot ulcer: Secondary | ICD-10-CM | POA: Diagnosis not present

## 2018-02-01 DIAGNOSIS — L97522 Non-pressure chronic ulcer of other part of left foot with fat layer exposed: Secondary | ICD-10-CM | POA: Diagnosis not present

## 2018-02-01 DIAGNOSIS — Z89519 Acquired absence of unspecified leg below knee: Secondary | ICD-10-CM | POA: Diagnosis not present

## 2018-02-04 ENCOUNTER — Emergency Department (HOSPITAL_COMMUNITY)
Admission: EM | Admit: 2018-02-04 | Discharge: 2018-02-04 | Disposition: A | Discharge status: Home / Self Care | Payer: Medicare Other | Attending: Emergency Medicine | Admitting: Emergency Medicine

## 2018-02-04 ENCOUNTER — Emergency Department (HOSPITAL_COMMUNITY): Payer: Medicare Other

## 2018-02-04 ENCOUNTER — Encounter (HOSPITAL_COMMUNITY): Payer: Self-pay | Admitting: Obstetrics and Gynecology

## 2018-02-04 ENCOUNTER — Other Ambulatory Visit: Payer: Self-pay

## 2018-02-04 DIAGNOSIS — M79644 Pain in right finger(s): Secondary | ICD-10-CM | POA: Diagnosis present

## 2018-02-04 DIAGNOSIS — Z7982 Long term (current) use of aspirin: Secondary | ICD-10-CM | POA: Insufficient documentation

## 2018-02-04 DIAGNOSIS — M7989 Other specified soft tissue disorders: Secondary | ICD-10-CM | POA: Diagnosis not present

## 2018-02-04 DIAGNOSIS — Z79899 Other long term (current) drug therapy: Secondary | ICD-10-CM | POA: Diagnosis not present

## 2018-02-04 DIAGNOSIS — L03011 Cellulitis of right finger: Secondary | ICD-10-CM | POA: Diagnosis not present

## 2018-02-04 MED ORDER — PENTAFLUOROPROP-TETRAFLUOROETH EX AERO
INHALATION_SPRAY | Freq: Once | CUTANEOUS | Status: DC
  Filled 2018-02-04: qty 116

## 2018-02-04 MED ORDER — DOXYCYCLINE HYCLATE 100 MG PO CAPS: 100.0000 mg | ORAL_CAPSULE | Freq: Two times a day (BID) | ORAL | 0 refills | Status: DC
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
## 2018-02-04 NOTE — ED Triage Notes (Signed)
Pt reports a possible cellulitis in his right thumb. Pt reports this has happened in the past to his ankle and foot and it feels about the same. Pt reports the swelling is almost 2X the normal size. Obvious swelling on the pad of the thumb.

## 2018-02-04 NOTE — Discharge Instructions (Addendum)
Take the antibiotic Doxycycline as directed, and until completed. Perform warm soapy water soaks in Half-strength hydrogen peroxide for 5-10 mins, twice daily x 5-7 days. Alternate between tylenol and motrin as needed for pain. Please followup with your primary doctor, an urgent care, or the emergency department for wound check in 2-3 days. Watch for increasing pain, worsening swelling, spreading redness, drainage/pus, or fever, and return to the emergency department if any of these symptoms occur, or for any other changes/worsening symptoms.

## 2018-02-04 NOTE — ED Notes (Signed)
ED Provider at bedside. 

## 2018-02-07 ENCOUNTER — Encounter (HOSPITAL_BASED_OUTPATIENT_CLINIC_OR_DEPARTMENT_OTHER): Payer: Self-pay | Admitting: *Deleted

## 2018-02-07 ENCOUNTER — Other Ambulatory Visit: Payer: Self-pay

## 2018-02-07 NOTE — Progress Notes (Addendum)
Spoke w/ pt via phone for pre-op interview.  Npo after mn w/ exception clear liquids until 0730 then nothing by mouth including candy,gum,mints.  Will take am meds w/ sips of water.  Needs istat 8.  Current ekg in chart and epic.  Cardiologist lov note dated 07-11-2017, nephrologist lov note dated 12-05-2017, and endocrinologist lov note dated 12-12-2017 with chart.  Pt has insulin pump in place with novolog insulin, will leave on until arrive dos.  ADDENDUM:  Received pt pcp H&P via fax from dr Baker Pierini office.

## 2018-02-14 ENCOUNTER — Encounter (HOSPITAL_BASED_OUTPATIENT_CLINIC_OR_DEPARTMENT_OTHER)
Admission: RE | Disposition: A | Discharge status: Home / Self Care | Payer: Self-pay | Source: Home / Self Care | Attending: Podiatry

## 2018-02-14 ENCOUNTER — Encounter (HOSPITAL_BASED_OUTPATIENT_CLINIC_OR_DEPARTMENT_OTHER): Payer: Self-pay

## 2018-02-14 ENCOUNTER — Ambulatory Visit (HOSPITAL_BASED_OUTPATIENT_CLINIC_OR_DEPARTMENT_OTHER)
Admission: RE | Admit: 2018-02-14 | Discharge: 2018-02-14 | Disposition: A | Discharge status: Home / Self Care | Payer: Medicare Other | Attending: Podiatry | Admitting: Podiatry

## 2018-02-14 ENCOUNTER — Ambulatory Visit (HOSPITAL_BASED_OUTPATIENT_CLINIC_OR_DEPARTMENT_OTHER): Payer: Medicare Other | Admitting: Anesthesiology

## 2018-02-14 DIAGNOSIS — E10621 Type 1 diabetes mellitus with foot ulcer: Secondary | ICD-10-CM | POA: Insufficient documentation

## 2018-02-14 DIAGNOSIS — G4730 Sleep apnea, unspecified: Secondary | ICD-10-CM | POA: Diagnosis not present

## 2018-02-14 DIAGNOSIS — L97522 Non-pressure chronic ulcer of other part of left foot with fat layer exposed: Secondary | ICD-10-CM | POA: Diagnosis not present

## 2018-02-14 DIAGNOSIS — L97529 Non-pressure chronic ulcer of other part of left foot with unspecified severity: Secondary | ICD-10-CM | POA: Diagnosis not present

## 2018-02-14 DIAGNOSIS — L97429 Non-pressure chronic ulcer of left heel and midfoot with unspecified severity: Secondary | ICD-10-CM | POA: Diagnosis not present

## 2018-02-14 DIAGNOSIS — E11621 Type 2 diabetes mellitus with foot ulcer: Secondary | ICD-10-CM | POA: Diagnosis not present

## 2018-02-14 DIAGNOSIS — E1051 Type 1 diabetes mellitus with diabetic peripheral angiopathy without gangrene: Secondary | ICD-10-CM | POA: Insufficient documentation

## 2018-02-14 HISTORY — DX: Presence of insulin pump (external) (internal): Z96.41

## 2018-02-14 HISTORY — PX: GRAFT APPLICATION: SHX6696

## 2018-02-14 LAB — POCT I-STAT, CHEM 8
BUN: 32 mg/dL — ABNORMAL HIGH (ref 6–20)
Calcium, Ion: 1.09 mmol/L — ABNORMAL LOW (ref 1.15–1.40)
Chloride: 106 mmol/L (ref 98–111)
Creatinine, Ser: 1.8 mg/dL — ABNORMAL HIGH (ref 0.61–1.24)
Glucose, Bld: 211 mg/dL — ABNORMAL HIGH (ref 70–99)
HCT: 45 % (ref 39.0–52.0)
Hemoglobin: 15.3 g/dL (ref 13.0–17.0)
Potassium: 4.5 mmol/L (ref 3.5–5.1)
Sodium: 137 mmol/L (ref 135–145)
TCO2: 24 mmol/L (ref 22–32)

## 2018-02-14 SURGERY — GRAFT APPLICATION
Anesthesia: Monitor Anesthesia Care | Site: Foot | Laterality: Left

## 2018-02-14 MED ORDER — LIDOCAINE 2% (20 MG/ML) 5 ML SYRINGE
INTRAMUSCULAR | Status: DC | PRN
  Administered 2018-02-14: 25 mg via INTRAVENOUS

## 2018-02-14 MED ORDER — CHLORHEXIDINE GLUCONATE CLOTH 2 % EX PADS
6.0000 | MEDICATED_PAD | Freq: Once | CUTANEOUS | Status: DC
  Filled 2018-02-14: qty 6

## 2018-02-14 MED ORDER — SODIUM CHLORIDE 0.9 % IV SOLN
INTRAVENOUS | Status: DC | PRN
  Administered 2018-02-14: 500 mL

## 2018-02-14 MED ORDER — ONDANSETRON HCL 4 MG/2ML IJ SOLN
INTRAMUSCULAR | Status: AC
  Filled 2018-02-14: qty 2

## 2018-02-14 MED ORDER — PROPOFOL 500 MG/50ML IV EMUL
INTRAVENOUS | Status: DC | PRN
  Administered 2018-02-14: 200 ug/kg/min via INTRAVENOUS

## 2018-02-14 MED ORDER — BUPIVACAINE HCL 0.5 % IJ SOLN
INTRAMUSCULAR | Status: DC | PRN
  Administered 2018-02-14: 10 mL

## 2018-02-14 MED ORDER — FENTANYL CITRATE (PF) 100 MCG/2ML IJ SOLN
INTRAMUSCULAR | Status: AC
  Filled 2018-02-14: qty 2

## 2018-02-14 MED ORDER — CEFAZOLIN SODIUM-DEXTROSE 2-4 GM/100ML-% IV SOLN
2.0000 g | INTRAVENOUS | Status: AC
  Administered 2018-02-14: 2 g via INTRAVENOUS
  Filled 2018-02-14: qty 100

## 2018-02-14 MED ORDER — PROPOFOL 500 MG/50ML IV EMUL
INTRAVENOUS | Status: AC
  Filled 2018-02-14: qty 50

## 2018-02-14 MED ORDER — MIDAZOLAM HCL 2 MG/2ML IJ SOLN
INTRAMUSCULAR | Status: DC | PRN
  Administered 2018-02-14: 2 mg via INTRAVENOUS

## 2018-02-14 MED ORDER — CEFAZOLIN SODIUM-DEXTROSE 2-4 GM/100ML-% IV SOLN
INTRAVENOUS | Status: AC
  Filled 2018-02-14: qty 100

## 2018-02-14 MED ORDER — LACTATED RINGERS IV SOLN
INTRAVENOUS | Status: DC
  Administered 2018-02-14: 13:00:00 via INTRAVENOUS
  Filled 2018-02-14: qty 1000

## 2018-02-14 MED ORDER — LIDOCAINE 2% (20 MG/ML) 5 ML SYRINGE
INTRAMUSCULAR | Status: AC
  Filled 2018-02-14: qty 5

## 2018-02-14 MED ORDER — LIDOCAINE HCL 2 % IJ SOLN
INTRAMUSCULAR | Status: DC | PRN
  Administered 2018-02-14: 7 mL

## 2018-02-14 SURGICAL SUPPLY — 80 items
BAG DECANTER FOR FLEXI CONT (MISCELLANEOUS) ×2 IMPLANT
BANDAGE COBAN STERILE 2 (GAUZE/BANDAGES/DRESSINGS) ×3 IMPLANT
BENZOIN TINCTURE PRP APPL 2/3 (GAUZE/BANDAGES/DRESSINGS) ×2 IMPLANT
BLADE OSC/SAG .038X5.5 CUT EDG (BLADE) IMPLANT
BLADE SAW SAGI 13.0X70X1.19 (BLADE) IMPLANT
BLADE SAW SAGI 13.0X70X1.19MM (BLADE)
BLADE SURG 15 STRL LF DISP TIS (BLADE) ×4 IMPLANT
BLADE SURG 15 STRL SS (BLADE) ×4
BNDG CONFORM 2 STRL LF (GAUZE/BANDAGES/DRESSINGS) ×3 IMPLANT
BNDG ESMARK 4X9 LF (GAUZE/BANDAGES/DRESSINGS) ×1 IMPLANT
BNDG GAUZE ELAST 4 BULKY (GAUZE/BANDAGES/DRESSINGS) ×2 IMPLANT
BUR SIDE CUT 44.8 STRL (BURR) IMPLANT
BURR SIDE CUT 44.8 STRL (BURR)
BURR SIDE CUT 44.8MML STRL (BURR)
CLOSURE WOUND 1/2 X4 (GAUZE/BANDAGES/DRESSINGS) ×1
COVER BACK TABLE 60X90IN (DRAPES) ×3 IMPLANT
COVER MAYO STAND STRL (DRAPES) ×3 IMPLANT
COVER WAND RF STERILE (DRAPES) ×4 IMPLANT
CUFF TOURN SGL QUICK 18 (TOURNIQUET CUFF) ×2 IMPLANT
CUFF TOURNIQUET SINGLE 18IN (TOURNIQUET CUFF) ×3 IMPLANT
DRAPE EXTREMITY T 121X128X90 (DISPOSABLE) ×3 IMPLANT
DRAPE OEC MINIVIEW 54X84 (DRAPES) ×1 IMPLANT
DRAPE SURG 17X23 STRL (DRAPES) ×3 IMPLANT
DRSG EMULSION OIL 3X3 NADH (GAUZE/BANDAGES/DRESSINGS) ×2 IMPLANT
DURAPREP 26ML APPLICATOR (WOUND CARE) ×1 IMPLANT
ELECT REM PT RETURN 9FT ADLT (ELECTROSURGICAL) ×3
ELECTRODE REM PT RTRN 9FT ADLT (ELECTROSURGICAL) ×1 IMPLANT
GAUZE SPONGE 4X4 12PLY STRL (GAUZE/BANDAGES/DRESSINGS) ×3 IMPLANT
GAUZE XEROFORM 1X8 LF (GAUZE/BANDAGES/DRESSINGS) ×1 IMPLANT
GLOVE BIO SURGEON STRL SZ7 (GLOVE) ×2 IMPLANT
GLOVE BIO SURGEON STRL SZ7.5 (GLOVE) ×3 IMPLANT
GLOVE BIOGEL PI IND STRL 7.0 (GLOVE) IMPLANT
GLOVE BIOGEL PI IND STRL 7.5 (GLOVE) ×1 IMPLANT
GLOVE BIOGEL PI INDICATOR 7.0 (GLOVE) ×2
GLOVE BIOGEL PI INDICATOR 7.5 (GLOVE) ×2
GOWN STRL REUS W/ TWL XL LVL3 (GOWN DISPOSABLE) ×1 IMPLANT
GOWN STRL REUS W/TWL LRG LVL3 (GOWN DISPOSABLE) ×2 IMPLANT
GOWN STRL REUS W/TWL XL LVL3 (GOWN DISPOSABLE) ×4
IMPL STRAVIX 3X6 (Tissue) IMPLANT
IMPLANT STRAVIX 3X6 (Tissue) ×3 IMPLANT
KIT TURNOVER CYSTO (KITS) ×3 IMPLANT
KWIRE 4.0 X .045IN (WIRE) IMPLANT
LEGGING LITHOTOMY PAIR STRL (DRAPES) ×2 IMPLANT
MANIFOLD NEPTUNE II (INSTRUMENTS) IMPLANT
NDL SAFETY ECLIPSE 18X1.5 (NEEDLE) ×1 IMPLANT
NEEDLE 27GAX1X1/2 (NEEDLE) ×6 IMPLANT
NEEDLE HYPO 18GX1.5 SHARP (NEEDLE) ×2
NEEDLE HYPO 22GX1.5 SAFETY (NEEDLE) IMPLANT
PACK BASIN DAY SURGERY FS (CUSTOM PROCEDURE TRAY) ×3 IMPLANT
PADDING CAST ABS 4INX4YD NS (CAST SUPPLIES)
PADDING CAST ABS COTTON 4X4 ST (CAST SUPPLIES) IMPLANT
PADDING CAST SYNTHETIC 2 (CAST SUPPLIES)
PADDING CAST SYNTHETIC 2X4 NS (CAST SUPPLIES) IMPLANT
PADDING CAST SYNTHETIC 3 NS LF (CAST SUPPLIES)
PADDING CAST SYNTHETIC 3X4 NS (CAST SUPPLIES) IMPLANT
PASSER SUT SWANSON 36MM LOOP (INSTRUMENTS) ×1 IMPLANT
PENCIL BUTTON HOLSTER BLD 10FT (ELECTRODE) IMPLANT
SCOTCHCAST PLUS 2X4 WHITE (CAST SUPPLIES) IMPLANT
SCOTCHCAST PLUS 3X4 WHITE (CAST SUPPLIES) IMPLANT
SCOTCHCAST PLUS 4X4 WHITE (CAST SUPPLIES) IMPLANT
STOCKINETTE 4X48 STRL (DRAPES) ×2 IMPLANT
STOCKINETTE 6  STRL (DRAPES) ×2
STOCKINETTE 6 STRL (DRAPES) ×1 IMPLANT
STOCKINETTE SYNTHETIC 4 NONSTR (MISCELLANEOUS) ×3 IMPLANT
STRIP CLOSURE SKIN 1/2X4 (GAUZE/BANDAGES/DRESSINGS) ×1 IMPLANT
SUT ETHILON 4 0 PS 2 18 (SUTURE) IMPLANT
SUT ETHILON 5 0 PS 2 18 (SUTURE) IMPLANT
SUT PROLENE 4 0 PS 2 18 (SUTURE) ×6 IMPLANT
SUT VIC AB 3-0 FS2 27 (SUTURE) ×3 IMPLANT
SUT VIC AB 4-0 PS2 27 (SUTURE) IMPLANT
SUT VIC AB 5-0 PS2 18 (SUTURE) IMPLANT
SUT VICRYL 4-0 PS2 18IN ABS (SUTURE) ×1 IMPLANT
SYR 3ML 18GX1 1/2 (SYRINGE) ×3 IMPLANT
SYR BULB 3OZ (MISCELLANEOUS) ×3 IMPLANT
SYR CONTROL 10ML LL (SYRINGE) ×6 IMPLANT
TOWEL OR 17X24 6PK STRL BLUE (TOWEL DISPOSABLE) ×4 IMPLANT
TRAY DSU PREP LF (CUSTOM PROCEDURE TRAY) ×2 IMPLANT
TUBE CONNECTING 12'X1/4 (SUCTIONS) ×1
TUBE CONNECTING 12X1/4 (SUCTIONS) ×1 IMPLANT
UNDERPAD 30X30 (UNDERPADS AND DIAPERS) ×3 IMPLANT

## 2018-02-14 NOTE — Anesthesia Procedure Notes (Addendum)
Procedure Name: MAC Date/Time: 02/14/2018 1:52 PM Performed by: Wanita Chamberlain, CRNA Pre-anesthesia Checklist: Patient identified, Emergency Drugs available, Suction available and Patient being monitored Oxygen Delivery Method: Nasal cannula Placement Confirmation: breath sounds checked- equal and bilateral,  positive ETCO2 and CO2 detector Dental Injury: Teeth and Oropharynx as per pre-operative assessment

## 2018-02-14 NOTE — Pre-Procedure Instructions (Signed)
Patient presents for left foot treatment of chronic ulceration with debridement and application of Stravix.  No contrainidcations.  H&P reviewed and there are no changes.

## 2018-02-14 NOTE — Transfer of Care (Signed)
  Last Vitals:  Vitals Value Taken Time  BP 161/104 02/14/2018  3:15 PM  Temp    Pulse 82 02/14/2018  3:20 PM  Resp 16 02/14/2018  3:20 PM  SpO2 100 % 02/14/2018  3:20 PM  Vitals shown include unvalidated device data.  Last Pain:  Vitals:   02/14/18 1515  TempSrc:   PainSc: 0-No pain         Immediate Anesthesia Transfer of Care Note  Patient: Tony Fisher  Procedure(s) Performed: Procedure(s) (LRB): APPLICATION STRAVIX LEFT FOOT, WOUND DEBRIDEMENT OF CHRONIC ULCER (Left)  Patient Location: PACU  Anesthesia Type:MAC  Level of Consciousness: awake, alert  and oriented  Airway & Oxygen Therapy: Patient Spontanous Breathing and Patient connected to face mask oxygen  Post-op Assessment: Report given to PACU RN and Post -op Vital signs reviewed and stable  Post vital signs: Reviewed and stable  Complications: No apparent anesthesia complications

## 2018-02-14 NOTE — Discharge Instructions (Signed)
Keep dressing clean and dry Reinforce as needed for drainage Follow up in office in one week  Call your surgeon if you experience:   1.  Fever over 101.0. 2. Nausea and/or vomiting 3. Continued bleeding from the incision. 4. Any problems and/or concerns  Post Anesthesia Home Care Instructions  Activity: Get plenty of rest for the remainder of the day. A responsible individual must stay with you for 24 hours following the procedure.  For the next 24 hours, DO NOT: -Drive a car -Paediatric nurse -Drink alcoholic beverages -Take any medication unless instructed by your physician -Make any legal decisions or sign important papers.  Meals: Start with liquid foods such as gelatin or soup. Progress to regular foods as tolerated. Avoid greasy, spicy, heavy foods. If nausea and/or vomiting occur, drink only clear liquids until the nausea and/or vomiting subsides. Call your physician if vomiting continues.  Special Instructions/Symptoms: Your throat may feel dry or sore from the anesthesia or the breathing tube placed in your throat during surgery. If this causes discomfort, gargle with warm salt water. The discomfort should disappear within 24 hours.  If you had a scopolamine patch placed behind your ear for the management of post- operative nausea and/or vomiting:  1. The medication in the patch is effective for 72 hours, after which it should be removed.  Wrap patch in a tissue and discard in the trash. Wash hands thoroughly with soap and water. 2. You may remove the patch earlier than 72 hours if you experience unpleasant side effects which may include dry mouth, dizziness or visual disturbances. 3. Avoid touching the patch. Wash your hands with soap and water after contact with the patch.

## 2018-02-14 NOTE — H&P (Signed)
Anesthesia H&P Update: History and Physical Exam reviewed; patient is OK for planned anesthetic and procedure. ? ?

## 2018-02-14 NOTE — Anesthesia Preprocedure Evaluation (Addendum)
Anesthesia Evaluation  Patient identified by MRN, date of birth, ID band Patient awake    Reviewed: Allergy & Precautions, NPO status , Patient's Chart, lab work & pertinent test results  History of Anesthesia Complications (+) PONV and history of anesthetic complications  Airway Mallampati: II  TM Distance: >3 FB Neck ROM: Full    Dental no notable dental hx. (+) Poor Dentition, Chipped, Missing, Loose, Dental Advisory Given,    Pulmonary sleep apnea ,    Pulmonary exam normal breath sounds clear to auscultation       Cardiovascular + Peripheral Vascular Disease  Normal cardiovascular exam Rhythm:Regular Rate:Normal     Neuro/Psych negative neurological ROS  negative psych ROS   GI/Hepatic negative GI ROS, Neg liver ROS,   Endo/Other  diabetes, Type 1  Renal/GU CRFRenal disease  negative genitourinary   Musculoskeletal negative musculoskeletal ROS (+)   Abdominal   Peds negative pediatric ROS (+)  Hematology  (+) Blood dyscrasia, anemia ,   Anesthesia Other Findings   Reproductive/Obstetrics negative OB ROS                            Anesthesia Physical  Anesthesia Plan  ASA: III  Anesthesia Plan: General   Post-op Pain Management:    Induction: Intravenous  PONV Risk Score and Plan: 2 and Ondansetron, Treatment may vary due to age or medical condition, Scopolamine patch - Pre-op and Dexamethasone  Airway Management Planned: LMA  Additional Equipment:   Intra-op Plan:   Post-operative Plan:   Informed Consent: I have reviewed the patients History and Physical, chart, labs and discussed the procedure including the risks, benefits and alternatives for the proposed anesthesia with the patient or authorized representative who has indicated his/her understanding and acceptance.     Dental advisory given  Plan Discussed with: CRNA, Surgeon and  Anesthesiologist  Anesthesia Plan Comments: ( )        Anesthesia Quick Evaluation

## 2018-02-15 ENCOUNTER — Encounter (HOSPITAL_BASED_OUTPATIENT_CLINIC_OR_DEPARTMENT_OTHER): Payer: Self-pay | Admitting: Podiatry

## 2018-02-15 NOTE — Anesthesia Postprocedure Evaluation (Signed)
Anesthesia Post Note  Patient: Tony Fisher  Procedure(s) Performed: APPLICATION STRAVIX LEFT FOOT, WOUND DEBRIDEMENT OF CHRONIC ULCER (Left Foot)     Patient location during evaluation: PACU Anesthesia Type: MAC Level of consciousness: awake and alert Pain management: pain level controlled Vital Signs Assessment: post-procedure vital signs reviewed and stable Respiratory status: spontaneous breathing, nonlabored ventilation and respiratory function stable Cardiovascular status: stable and blood pressure returned to baseline Postop Assessment: no apparent nausea or vomiting Anesthetic complications: no    Last Vitals:  Vitals:   02/14/18 1600 02/14/18 1615  BP: (!) 159/95   Pulse: 87   Resp: 16   Temp:  36.5 C  SpO2: 99%     Last Pain:  Vitals:   02/14/18 1615  TempSrc:   PainSc: 0-No pain                 Lynda Rainwater

## 2018-02-19 NOTE — Op Note (Signed)
NAME: Tony Fisher, [place]ELDH[place]SE MEDICAL RECORD EZ:[id number] ACCOUNT [id number] DATE OF BIRTH:03-04-1970 FACILITY: [facility] LOCATION: [facility] PHYSICIAN:Yanel Dombrosky, DPM  OPERATIVE REPORT  DATE OF PROCEDURE:  02/14/2018  SURGEON:  Rosemary Holms, DPM  ASSISTANT:  None.  PREOPERATIVE DIAGNOSIS:  Chronic ulceration of the heel, left foot.  POSTOPERATIVE DIAGNOSIS:  Chronic ulceration of the heel, left foot.  PROCEDURE:  Preparation of left wound bed and application of Stravix graft.  ANESTHESIA:  Sedation.  INDICATION:  The patient was brought to the OR and placed in the supine position, at which time monitored anesthesia care was administered.  The patient was prepped and draped in the usual aseptic manner.  Attention was directed to the left heel where  photographs were taken.  Measurements were also taken, and this measured 3.1 cm in length and 2.1 cm in width.  The patient has a history of chronic wound secondary to diabetes and has failed to respond to good standard wound care for nearly 1 year,  which has included debridement to the wound bed and advanced dressings, collagen, Santyl, Grafix.  Therefore, use of advanced therapies are indicated at this time.  Underlying disease is being treated by several physicians including a vascular surgeon,  Dr. Donzetta Matters.  The patient has been offloaded for the diabetic foot ulcer.  No signs of infection or abscesses were noted, but there is absence of acute wound infection and absence of osteomyelitis.  The patient is adequately nourished for healing.  DESCRIPTION OF PROCEDURE:  The patient presents for sterile application of a 3 cm x 6 cm piece of Stravix.  The foot was prepped and attention directed to the wound site.  The wound site was surgically debrided, full thickness, and the wound bed excised  with a 15 blade to prepare the wound site for graft tissue.  All nonviable tissue was removed and debrided from the procedural field and the wound site.   Care was taken to assure the best wound bed for application of the graft.  No sign of infection or  abscess was noted.  The wound site was flushed with copious amounts of sterile saline solution.  The sterile graft was removed from its package and placed in saline, and then it was carefully applied to the wound base using forceps.  At this point, great  care was taken to make sure the graft was in full contact with the wound base, and 4-0 Prolene was used to suture the graft around the perimeter.  No seroma or hematoma was noted with the application as the graft was also fenestrated.  Prolene was used  to anchor the graft and secure in position, and complete contact was achieved.  Then, Adaptic was applied and placed over the graft to maintain moisture and prevent infection as well as Steri-Strips, fluffs, 4 x 4's, Kling and Coban.  The patient was  then sent to the recovery room with vital signs stable and capillary refill time presurgical levels.  PLAN:  Postop instructions were given including absolute nonweightbearing, keep dressing dry and intact, and he is to follow up in 8 days.  LN/NUANCE  D:02/18/2018 T:02/19/2018 JOB:[id number]

## 2018-02-22 DIAGNOSIS — E11621 Type 2 diabetes mellitus with foot ulcer: Secondary | ICD-10-CM | POA: Diagnosis not present

## 2018-02-22 DIAGNOSIS — E1129 Type 2 diabetes mellitus with other diabetic kidney complication: Secondary | ICD-10-CM | POA: Diagnosis not present

## 2018-02-22 DIAGNOSIS — L97522 Non-pressure chronic ulcer of other part of left foot with fat layer exposed: Secondary | ICD-10-CM | POA: Diagnosis not present

## 2018-03-05 DIAGNOSIS — E11621 Type 2 diabetes mellitus with foot ulcer: Secondary | ICD-10-CM | POA: Diagnosis not present

## 2018-03-05 DIAGNOSIS — E1129 Type 2 diabetes mellitus with other diabetic kidney complication: Secondary | ICD-10-CM | POA: Diagnosis not present

## 2018-03-05 DIAGNOSIS — L97522 Non-pressure chronic ulcer of other part of left foot with fat layer exposed: Secondary | ICD-10-CM | POA: Diagnosis not present

## 2018-03-15 DIAGNOSIS — L97522 Non-pressure chronic ulcer of other part of left foot with fat layer exposed: Secondary | ICD-10-CM | POA: Diagnosis not present

## 2018-03-15 DIAGNOSIS — E1129 Type 2 diabetes mellitus with other diabetic kidney complication: Secondary | ICD-10-CM | POA: Diagnosis not present

## 2018-03-15 DIAGNOSIS — E11621 Type 2 diabetes mellitus with foot ulcer: Secondary | ICD-10-CM | POA: Diagnosis not present

## 2018-03-20 DIAGNOSIS — E10621 Type 1 diabetes mellitus with foot ulcer: Secondary | ICD-10-CM | POA: Diagnosis not present

## 2018-03-20 DIAGNOSIS — Z940 Kidney transplant status: Secondary | ICD-10-CM | POA: Diagnosis not present

## 2018-03-20 DIAGNOSIS — E1042 Type 1 diabetes mellitus with diabetic polyneuropathy: Secondary | ICD-10-CM | POA: Diagnosis not present

## 2018-03-20 DIAGNOSIS — E1021 Type 1 diabetes mellitus with diabetic nephropathy: Secondary | ICD-10-CM | POA: Diagnosis not present

## 2018-03-20 DIAGNOSIS — L97529 Non-pressure chronic ulcer of other part of left foot with unspecified severity: Secondary | ICD-10-CM | POA: Diagnosis not present

## 2018-03-20 DIAGNOSIS — E1022 Type 1 diabetes mellitus with diabetic chronic kidney disease: Secondary | ICD-10-CM | POA: Diagnosis not present

## 2018-03-20 DIAGNOSIS — Z89511 Acquired absence of right leg below knee: Secondary | ICD-10-CM | POA: Diagnosis not present

## 2018-03-20 DIAGNOSIS — E10319 Type 1 diabetes mellitus with unspecified diabetic retinopathy without macular edema: Secondary | ICD-10-CM | POA: Diagnosis not present

## 2018-03-22 DIAGNOSIS — E11621 Type 2 diabetes mellitus with foot ulcer: Secondary | ICD-10-CM | POA: Diagnosis not present

## 2018-03-22 DIAGNOSIS — L97522 Non-pressure chronic ulcer of other part of left foot with fat layer exposed: Secondary | ICD-10-CM | POA: Diagnosis not present

## 2018-03-22 DIAGNOSIS — E1129 Type 2 diabetes mellitus with other diabetic kidney complication: Secondary | ICD-10-CM | POA: Diagnosis not present

## 2018-03-27 DIAGNOSIS — I739 Peripheral vascular disease, unspecified: Secondary | ICD-10-CM | POA: Diagnosis not present

## 2018-03-27 DIAGNOSIS — E1021 Type 1 diabetes mellitus with diabetic nephropathy: Secondary | ICD-10-CM | POA: Diagnosis not present

## 2018-03-27 DIAGNOSIS — Z794 Long term (current) use of insulin: Secondary | ICD-10-CM | POA: Diagnosis not present

## 2018-03-27 DIAGNOSIS — Z89511 Acquired absence of right leg below knee: Secondary | ICD-10-CM | POA: Diagnosis not present

## 2018-03-27 DIAGNOSIS — Z940 Kidney transplant status: Secondary | ICD-10-CM | POA: Diagnosis not present

## 2018-03-27 DIAGNOSIS — N319 Neuromuscular dysfunction of bladder, unspecified: Secondary | ICD-10-CM | POA: Diagnosis not present

## 2018-03-27 DIAGNOSIS — E11621 Type 2 diabetes mellitus with foot ulcer: Secondary | ICD-10-CM | POA: Diagnosis not present

## 2018-03-27 DIAGNOSIS — I10 Essential (primary) hypertension: Secondary | ICD-10-CM | POA: Diagnosis not present

## 2018-03-27 DIAGNOSIS — T86891 Other transplanted tissue failure: Secondary | ICD-10-CM | POA: Diagnosis not present

## 2018-03-27 DIAGNOSIS — L97509 Non-pressure chronic ulcer of other part of unspecified foot with unspecified severity: Secondary | ICD-10-CM | POA: Diagnosis not present

## 2018-03-27 DIAGNOSIS — Z4822 Encounter for aftercare following kidney transplant: Secondary | ICD-10-CM | POA: Diagnosis not present

## 2018-03-27 DIAGNOSIS — Z7952 Long term (current) use of systemic steroids: Secondary | ICD-10-CM | POA: Diagnosis not present

## 2018-03-27 DIAGNOSIS — Z79899 Other long term (current) drug therapy: Secondary | ICD-10-CM | POA: Diagnosis not present

## 2018-03-29 DIAGNOSIS — H25813 Combined forms of age-related cataract, bilateral: Secondary | ICD-10-CM | POA: Diagnosis not present

## 2018-03-29 DIAGNOSIS — E11621 Type 2 diabetes mellitus with foot ulcer: Secondary | ICD-10-CM | POA: Diagnosis not present

## 2018-03-29 DIAGNOSIS — E1129 Type 2 diabetes mellitus with other diabetic kidney complication: Secondary | ICD-10-CM | POA: Diagnosis not present

## 2018-03-29 DIAGNOSIS — H52203 Unspecified astigmatism, bilateral: Secondary | ICD-10-CM | POA: Diagnosis not present

## 2018-03-29 DIAGNOSIS — H5213 Myopia, bilateral: Secondary | ICD-10-CM | POA: Diagnosis not present

## 2018-03-29 DIAGNOSIS — H524 Presbyopia: Secondary | ICD-10-CM | POA: Diagnosis not present

## 2018-03-29 DIAGNOSIS — L97522 Non-pressure chronic ulcer of other part of left foot with fat layer exposed: Secondary | ICD-10-CM | POA: Diagnosis not present

## 2018-03-29 DIAGNOSIS — Z89519 Acquired absence of unspecified leg below knee: Secondary | ICD-10-CM | POA: Diagnosis not present

## 2018-03-29 DIAGNOSIS — E103593 Type 1 diabetes mellitus with proliferative diabetic retinopathy without macular edema, bilateral: Secondary | ICD-10-CM | POA: Diagnosis not present

## 2018-04-12 DIAGNOSIS — E11621 Type 2 diabetes mellitus with foot ulcer: Secondary | ICD-10-CM | POA: Diagnosis not present

## 2018-04-12 DIAGNOSIS — E1129 Type 2 diabetes mellitus with other diabetic kidney complication: Secondary | ICD-10-CM | POA: Diagnosis not present

## 2018-04-12 DIAGNOSIS — Z89519 Acquired absence of unspecified leg below knee: Secondary | ICD-10-CM | POA: Diagnosis not present

## 2018-04-12 DIAGNOSIS — L97522 Non-pressure chronic ulcer of other part of left foot with fat layer exposed: Secondary | ICD-10-CM | POA: Diagnosis not present

## 2018-04-16 DIAGNOSIS — N2581 Secondary hyperparathyroidism of renal origin: Secondary | ICD-10-CM | POA: Diagnosis not present

## 2018-04-16 DIAGNOSIS — I129 Hypertensive chronic kidney disease with stage 1 through stage 4 chronic kidney disease, or unspecified chronic kidney disease: Secondary | ICD-10-CM | POA: Diagnosis not present

## 2018-04-16 DIAGNOSIS — Z9483 Pancreas transplant status: Secondary | ICD-10-CM | POA: Diagnosis not present

## 2018-04-16 DIAGNOSIS — E1122 Type 2 diabetes mellitus with diabetic chronic kidney disease: Secondary | ICD-10-CM | POA: Diagnosis not present

## 2018-04-16 DIAGNOSIS — K219 Gastro-esophageal reflux disease without esophagitis: Secondary | ICD-10-CM | POA: Diagnosis not present

## 2018-04-16 DIAGNOSIS — D631 Anemia in chronic kidney disease: Secondary | ICD-10-CM | POA: Diagnosis not present

## 2018-04-16 DIAGNOSIS — Z940 Kidney transplant status: Secondary | ICD-10-CM | POA: Diagnosis not present

## 2018-04-16 DIAGNOSIS — E10621 Type 1 diabetes mellitus with foot ulcer: Secondary | ICD-10-CM | POA: Diagnosis not present

## 2018-04-16 DIAGNOSIS — E11319 Type 2 diabetes mellitus with unspecified diabetic retinopathy without macular edema: Secondary | ICD-10-CM | POA: Diagnosis not present

## 2018-04-16 DIAGNOSIS — N189 Chronic kidney disease, unspecified: Secondary | ICD-10-CM | POA: Diagnosis not present

## 2018-04-16 DIAGNOSIS — I739 Peripheral vascular disease, unspecified: Secondary | ICD-10-CM | POA: Diagnosis not present

## 2018-04-16 DIAGNOSIS — Z9225 Personal history of immunosupression therapy: Secondary | ICD-10-CM | POA: Diagnosis not present

## 2018-04-25 DIAGNOSIS — L97522 Non-pressure chronic ulcer of other part of left foot with fat layer exposed: Secondary | ICD-10-CM | POA: Diagnosis not present

## 2018-05-11 DIAGNOSIS — E1040 Type 1 diabetes mellitus with diabetic neuropathy, unspecified: Secondary | ICD-10-CM | POA: Diagnosis not present

## 2018-05-11 DIAGNOSIS — Z89431 Acquired absence of right foot: Secondary | ICD-10-CM | POA: Diagnosis not present

## 2018-05-11 DIAGNOSIS — L97522 Non-pressure chronic ulcer of other part of left foot with fat layer exposed: Secondary | ICD-10-CM | POA: Diagnosis not present

## 2018-05-11 DIAGNOSIS — E1129 Type 2 diabetes mellitus with other diabetic kidney complication: Secondary | ICD-10-CM | POA: Diagnosis not present

## 2018-05-18 DIAGNOSIS — E1129 Type 2 diabetes mellitus with other diabetic kidney complication: Secondary | ICD-10-CM | POA: Diagnosis not present

## 2018-05-18 DIAGNOSIS — E1040 Type 1 diabetes mellitus with diabetic neuropathy, unspecified: Secondary | ICD-10-CM | POA: Diagnosis not present

## 2018-05-18 DIAGNOSIS — L97522 Non-pressure chronic ulcer of other part of left foot with fat layer exposed: Secondary | ICD-10-CM | POA: Diagnosis not present

## 2018-05-18 DIAGNOSIS — Z89431 Acquired absence of right foot: Secondary | ICD-10-CM | POA: Diagnosis not present

## 2018-05-21 DIAGNOSIS — E1040 Type 1 diabetes mellitus with diabetic neuropathy, unspecified: Secondary | ICD-10-CM | POA: Diagnosis not present

## 2018-05-21 DIAGNOSIS — E1129 Type 2 diabetes mellitus with other diabetic kidney complication: Secondary | ICD-10-CM | POA: Diagnosis not present

## 2018-05-21 DIAGNOSIS — L97522 Non-pressure chronic ulcer of other part of left foot with fat layer exposed: Secondary | ICD-10-CM | POA: Diagnosis not present

## 2018-06-01 DIAGNOSIS — E1040 Type 1 diabetes mellitus with diabetic neuropathy, unspecified: Secondary | ICD-10-CM | POA: Diagnosis not present

## 2018-06-01 DIAGNOSIS — Z89431 Acquired absence of right foot: Secondary | ICD-10-CM | POA: Diagnosis not present

## 2018-06-01 DIAGNOSIS — L97522 Non-pressure chronic ulcer of other part of left foot with fat layer exposed: Secondary | ICD-10-CM | POA: Diagnosis not present

## 2018-06-04 DIAGNOSIS — S88119A Complete traumatic amputation at level between knee and ankle, unspecified lower leg, initial encounter: Secondary | ICD-10-CM | POA: Insufficient documentation

## 2018-06-04 DIAGNOSIS — S88111D Complete traumatic amputation at level between knee and ankle, right lower leg, subsequent encounter: Secondary | ICD-10-CM | POA: Diagnosis not present

## 2018-06-08 DIAGNOSIS — Z89431 Acquired absence of right foot: Secondary | ICD-10-CM | POA: Diagnosis not present

## 2018-06-08 DIAGNOSIS — E1129 Type 2 diabetes mellitus with other diabetic kidney complication: Secondary | ICD-10-CM | POA: Diagnosis not present

## 2018-06-08 DIAGNOSIS — E1040 Type 1 diabetes mellitus with diabetic neuropathy, unspecified: Secondary | ICD-10-CM | POA: Diagnosis not present

## 2018-06-08 DIAGNOSIS — L97522 Non-pressure chronic ulcer of other part of left foot with fat layer exposed: Secondary | ICD-10-CM | POA: Diagnosis not present

## 2018-06-13 DIAGNOSIS — Z940 Kidney transplant status: Secondary | ICD-10-CM | POA: Diagnosis not present

## 2018-06-19 DIAGNOSIS — Z794 Long term (current) use of insulin: Secondary | ICD-10-CM | POA: Diagnosis not present

## 2018-06-19 DIAGNOSIS — E10319 Type 1 diabetes mellitus with unspecified diabetic retinopathy without macular edema: Secondary | ICD-10-CM | POA: Diagnosis not present

## 2018-06-19 DIAGNOSIS — E1021 Type 1 diabetes mellitus with diabetic nephropathy: Secondary | ICD-10-CM | POA: Diagnosis not present

## 2018-06-19 DIAGNOSIS — Z940 Kidney transplant status: Secondary | ICD-10-CM | POA: Diagnosis not present

## 2018-06-19 DIAGNOSIS — E1042 Type 1 diabetes mellitus with diabetic polyneuropathy: Secondary | ICD-10-CM | POA: Diagnosis not present

## 2018-06-19 DIAGNOSIS — Z89511 Acquired absence of right leg below knee: Secondary | ICD-10-CM | POA: Diagnosis not present

## 2018-06-22 DIAGNOSIS — L97522 Non-pressure chronic ulcer of other part of left foot with fat layer exposed: Secondary | ICD-10-CM | POA: Diagnosis not present

## 2018-06-22 DIAGNOSIS — E1040 Type 1 diabetes mellitus with diabetic neuropathy, unspecified: Secondary | ICD-10-CM | POA: Diagnosis not present

## 2018-06-22 DIAGNOSIS — Z89431 Acquired absence of right foot: Secondary | ICD-10-CM | POA: Diagnosis not present

## 2018-06-22 DIAGNOSIS — E1129 Type 2 diabetes mellitus with other diabetic kidney complication: Secondary | ICD-10-CM | POA: Diagnosis not present

## 2018-07-09 DIAGNOSIS — L97522 Non-pressure chronic ulcer of other part of left foot with fat layer exposed: Secondary | ICD-10-CM | POA: Diagnosis not present

## 2018-07-09 DIAGNOSIS — Z89431 Acquired absence of right foot: Secondary | ICD-10-CM | POA: Diagnosis not present

## 2018-07-09 DIAGNOSIS — E1129 Type 2 diabetes mellitus with other diabetic kidney complication: Secondary | ICD-10-CM | POA: Diagnosis not present

## 2018-07-09 DIAGNOSIS — E1040 Type 1 diabetes mellitus with diabetic neuropathy, unspecified: Secondary | ICD-10-CM | POA: Diagnosis not present

## 2018-07-24 DIAGNOSIS — Z89431 Acquired absence of right foot: Secondary | ICD-10-CM | POA: Diagnosis not present

## 2018-07-24 DIAGNOSIS — E1040 Type 1 diabetes mellitus with diabetic neuropathy, unspecified: Secondary | ICD-10-CM | POA: Diagnosis not present

## 2018-07-24 DIAGNOSIS — E1129 Type 2 diabetes mellitus with other diabetic kidney complication: Secondary | ICD-10-CM | POA: Diagnosis not present

## 2018-07-24 DIAGNOSIS — L97522 Non-pressure chronic ulcer of other part of left foot with fat layer exposed: Secondary | ICD-10-CM | POA: Diagnosis not present

## 2018-08-16 DIAGNOSIS — L97522 Non-pressure chronic ulcer of other part of left foot with fat layer exposed: Secondary | ICD-10-CM | POA: Diagnosis not present

## 2018-08-23 DIAGNOSIS — N189 Chronic kidney disease, unspecified: Secondary | ICD-10-CM | POA: Diagnosis not present

## 2018-08-23 DIAGNOSIS — Z9225 Personal history of immunosupression therapy: Secondary | ICD-10-CM | POA: Diagnosis not present

## 2018-08-23 DIAGNOSIS — E1122 Type 2 diabetes mellitus with diabetic chronic kidney disease: Secondary | ICD-10-CM | POA: Diagnosis not present

## 2018-08-23 DIAGNOSIS — I739 Peripheral vascular disease, unspecified: Secondary | ICD-10-CM | POA: Diagnosis not present

## 2018-08-23 DIAGNOSIS — E11319 Type 2 diabetes mellitus with unspecified diabetic retinopathy without macular edema: Secondary | ICD-10-CM | POA: Diagnosis not present

## 2018-08-23 DIAGNOSIS — Z940 Kidney transplant status: Secondary | ICD-10-CM | POA: Diagnosis not present

## 2018-08-23 DIAGNOSIS — N2581 Secondary hyperparathyroidism of renal origin: Secondary | ICD-10-CM | POA: Diagnosis not present

## 2018-08-23 DIAGNOSIS — D631 Anemia in chronic kidney disease: Secondary | ICD-10-CM | POA: Diagnosis not present

## 2018-08-23 DIAGNOSIS — L97519 Non-pressure chronic ulcer of other part of right foot with unspecified severity: Secondary | ICD-10-CM | POA: Diagnosis not present

## 2018-08-23 DIAGNOSIS — E10621 Type 1 diabetes mellitus with foot ulcer: Secondary | ICD-10-CM | POA: Diagnosis not present

## 2018-08-23 DIAGNOSIS — K219 Gastro-esophageal reflux disease without esophagitis: Secondary | ICD-10-CM | POA: Diagnosis not present

## 2018-08-23 DIAGNOSIS — Z9483 Pancreas transplant status: Secondary | ICD-10-CM | POA: Diagnosis not present

## 2018-08-23 DIAGNOSIS — I129 Hypertensive chronic kidney disease with stage 1 through stage 4 chronic kidney disease, or unspecified chronic kidney disease: Secondary | ICD-10-CM | POA: Diagnosis not present

## 2018-08-23 DIAGNOSIS — E11621 Type 2 diabetes mellitus with foot ulcer: Secondary | ICD-10-CM | POA: Diagnosis not present

## 2018-08-23 DIAGNOSIS — L97522 Non-pressure chronic ulcer of other part of left foot with fat layer exposed: Secondary | ICD-10-CM | POA: Diagnosis not present

## 2018-08-23 DIAGNOSIS — Z89519 Acquired absence of unspecified leg below knee: Secondary | ICD-10-CM | POA: Diagnosis not present

## 2018-08-28 ENCOUNTER — Other Ambulatory Visit (HOSPITAL_COMMUNITY)
Admission: RE | Admit: 2018-08-28 | Discharge: 2018-08-28 | Disposition: A | Discharge status: Home / Self Care | Payer: Medicare Other | Source: Ambulatory Visit | Attending: Podiatry | Admitting: Podiatry

## 2018-08-28 DIAGNOSIS — Z20828 Contact with and (suspected) exposure to other viral communicable diseases: Secondary | ICD-10-CM | POA: Diagnosis not present

## 2018-08-29 LAB — SARS CORONAVIRUS 2 (TAT 6-24 HRS): SARS Coronavirus 2: NEGATIVE

## 2018-08-30 ENCOUNTER — Other Ambulatory Visit: Payer: Self-pay

## 2018-08-30 ENCOUNTER — Encounter (HOSPITAL_BASED_OUTPATIENT_CLINIC_OR_DEPARTMENT_OTHER): Payer: Self-pay | Admitting: *Deleted

## 2018-08-30 DIAGNOSIS — Z940 Kidney transplant status: Secondary | ICD-10-CM | POA: Diagnosis not present

## 2018-08-30 NOTE — Progress Notes (Signed)
Spoke w/ pt via phone for pre-op interview.  Npo after mn w/ exception diabetic clear liquids until 0800 then nothing by mouth, pt verbalized understanding.  Arrive at 1200.  Need istat 8 and ekg.  Pt had covid test done 08-28-2018.  Will take am meds dos w/ sips of water.  Pt endocrinologist lov note in epic 06-19-2018.  Nephrologist lov dated 04-16-2018 in chart and epic scanned in media.  Pt cardiologist lov dated 07-11-2017 in epic and chart.  Lov note from transplant clinic in care everwhere.  Pt denies any cardiac S&S or diabetic symptoms.  Chart reviewed by anesthesia, Konrad Felix PA,  ok to proceed.   Pt has insulin pump, will leave on until arrives dos.  Pt pcp h&p received via fax from dr Fritzi Mandes office, in chart.

## 2018-08-30 NOTE — Anesthesia Preprocedure Evaluation (Addendum)
Anesthesia Evaluation  Patient identified by MRN, date of birth, ID band Patient awake    Reviewed: Allergy & Precautions, H&P , NPO status , Patient's Chart, lab work & pertinent test results  History of Anesthesia Complications (+) PONV and history of anesthetic complications  Airway Mallampati: II   Neck ROM: full    Dental   Pulmonary sleep apnea ,    breath sounds clear to auscultation       Cardiovascular + Peripheral Vascular Disease   Rhythm:regular Rate:Normal     Neuro/Psych    GI/Hepatic   Endo/Other  diabetes, Insulin Dependent  Renal/GU ESRFRenal disease     Musculoskeletal   Abdominal   Peds  Hematology  (+) Blood dyscrasia, anemia ,   Anesthesia Other Findings   Reproductive/Obstetrics                            Anesthesia Physical Anesthesia Plan  ASA: III  Anesthesia Plan: General   Post-op Pain Management:    Induction: Intravenous  PONV Risk Score and Plan: 3 and Ondansetron, Dexamethasone and Treatment may vary due to age or medical condition  Airway Management Planned: LMA  Additional Equipment:   Intra-op Plan:   Post-operative Plan:   Informed Consent: I have reviewed the patients History and Physical, chart, labs and discussed the procedure including the risks, benefits and alternatives for the proposed anesthesia with the patient or authorized representative who has indicated his/her understanding and acceptance.       Plan Discussed with: CRNA, Anesthesiologist and Surgeon  Anesthesia Plan Comments: (See PAT note 08/30/2018, Konrad Felix, PA-C)       Anesthesia Quick Evaluation
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
## 2018-08-31 ENCOUNTER — Other Ambulatory Visit: Payer: Self-pay

## 2018-08-31 ENCOUNTER — Encounter (HOSPITAL_BASED_OUTPATIENT_CLINIC_OR_DEPARTMENT_OTHER)
Admission: RE | Disposition: A | Discharge status: Home / Self Care | Payer: Self-pay | Source: Home / Self Care | Attending: Podiatry

## 2018-08-31 ENCOUNTER — Ambulatory Visit (HOSPITAL_BASED_OUTPATIENT_CLINIC_OR_DEPARTMENT_OTHER)
Admission: RE | Admit: 2018-08-31 | Discharge: 2018-08-31 | Disposition: A | Discharge status: Home / Self Care | Payer: Medicare Other | Attending: Podiatry | Admitting: Podiatry

## 2018-08-31 ENCOUNTER — Encounter (HOSPITAL_BASED_OUTPATIENT_CLINIC_OR_DEPARTMENT_OTHER): Payer: Self-pay | Admitting: Certified Registered"

## 2018-08-31 ENCOUNTER — Ambulatory Visit (HOSPITAL_BASED_OUTPATIENT_CLINIC_OR_DEPARTMENT_OTHER): Payer: Medicare Other | Admitting: Physician Assistant

## 2018-08-31 DIAGNOSIS — Z9483 Pancreas transplant status: Secondary | ICD-10-CM | POA: Insufficient documentation

## 2018-08-31 DIAGNOSIS — E1051 Type 1 diabetes mellitus with diabetic peripheral angiopathy without gangrene: Secondary | ICD-10-CM | POA: Insufficient documentation

## 2018-08-31 DIAGNOSIS — N2581 Secondary hyperparathyroidism of renal origin: Secondary | ICD-10-CM | POA: Diagnosis not present

## 2018-08-31 DIAGNOSIS — Z940 Kidney transplant status: Secondary | ICD-10-CM | POA: Diagnosis not present

## 2018-08-31 DIAGNOSIS — Z9641 Presence of insulin pump (external) (internal): Secondary | ICD-10-CM | POA: Diagnosis not present

## 2018-08-31 DIAGNOSIS — Z89511 Acquired absence of right leg below knee: Secondary | ICD-10-CM | POA: Insufficient documentation

## 2018-08-31 DIAGNOSIS — E1022 Type 1 diabetes mellitus with diabetic chronic kidney disease: Secondary | ICD-10-CM | POA: Diagnosis not present

## 2018-08-31 DIAGNOSIS — G4733 Obstructive sleep apnea (adult) (pediatric): Secondary | ICD-10-CM | POA: Insufficient documentation

## 2018-08-31 DIAGNOSIS — Z7982 Long term (current) use of aspirin: Secondary | ICD-10-CM | POA: Insufficient documentation

## 2018-08-31 DIAGNOSIS — L97529 Non-pressure chronic ulcer of other part of left foot with unspecified severity: Secondary | ICD-10-CM | POA: Insufficient documentation

## 2018-08-31 DIAGNOSIS — Z794 Long term (current) use of insulin: Secondary | ICD-10-CM | POA: Diagnosis not present

## 2018-08-31 DIAGNOSIS — S91302A Unspecified open wound, left foot, initial encounter: Secondary | ICD-10-CM | POA: Diagnosis not present

## 2018-08-31 DIAGNOSIS — N186 End stage renal disease: Secondary | ICD-10-CM | POA: Diagnosis not present

## 2018-08-31 DIAGNOSIS — Z79899 Other long term (current) drug therapy: Secondary | ICD-10-CM | POA: Diagnosis not present

## 2018-08-31 DIAGNOSIS — E11621 Type 2 diabetes mellitus with foot ulcer: Secondary | ICD-10-CM | POA: Diagnosis not present

## 2018-08-31 DIAGNOSIS — E10621 Type 1 diabetes mellitus with foot ulcer: Secondary | ICD-10-CM | POA: Insufficient documentation

## 2018-08-31 DIAGNOSIS — I132 Hypertensive heart and chronic kidney disease with heart failure and with stage 5 chronic kidney disease, or end stage renal disease: Secondary | ICD-10-CM | POA: Insufficient documentation

## 2018-08-31 DIAGNOSIS — L97522 Non-pressure chronic ulcer of other part of left foot with fat layer exposed: Secondary | ICD-10-CM | POA: Diagnosis not present

## 2018-08-31 DIAGNOSIS — E1122 Type 2 diabetes mellitus with diabetic chronic kidney disease: Secondary | ICD-10-CM | POA: Diagnosis not present

## 2018-08-31 HISTORY — PX: WOUND DEBRIDEMENT: SHX247

## 2018-08-31 HISTORY — PX: GRAFT APPLICATION: SHX6696

## 2018-08-31 LAB — POCT I-STAT, CHEM 8
BUN: 40 mg/dL — ABNORMAL HIGH (ref 6–20)
Calcium, Ion: 1.23 mmol/L (ref 1.15–1.40)
Chloride: 102 mmol/L (ref 98–111)
Creatinine, Ser: 1.7 mg/dL — ABNORMAL HIGH (ref 0.61–1.24)
Glucose, Bld: 300 mg/dL — ABNORMAL HIGH (ref 70–99)
HCT: 44 % (ref 39.0–52.0)
Hemoglobin: 15 g/dL (ref 13.0–17.0)
Potassium: 4.9 mmol/L (ref 3.5–5.1)
Sodium: 137 mmol/L (ref 135–145)
TCO2: 24 mmol/L (ref 22–32)

## 2018-08-31 SURGERY — DEBRIDEMENT, WOUND
Anesthesia: General | Site: Foot | Laterality: Left

## 2018-08-31 MED ORDER — ONDANSETRON HCL 4 MG/2ML IJ SOLN
INTRAMUSCULAR | Status: AC
  Filled 2018-08-31: qty 2

## 2018-08-31 MED ORDER — ONDANSETRON HCL 4 MG/2ML IJ SOLN
INTRAMUSCULAR | Status: DC | PRN
  Administered 2018-08-31: 4 mg via INTRAVENOUS

## 2018-08-31 MED ORDER — CHLORHEXIDINE GLUCONATE CLOTH 2 % EX PADS
6.0000 | MEDICATED_PAD | Freq: Once | CUTANEOUS | Status: DC
  Filled 2018-08-31: qty 6

## 2018-08-31 MED ORDER — SODIUM CHLORIDE 0.9% FLUSH
3.0000 mL | INTRAVENOUS | Status: DC | PRN
  Filled 2018-08-31: qty 3

## 2018-08-31 MED ORDER — LABETALOL HCL 5 MG/ML IV SOLN
10.0000 mg | Freq: Once | INTRAVENOUS | Status: AC
  Administered 2018-08-31: 10 mg via INTRAVENOUS
  Filled 2018-08-31: qty 4

## 2018-08-31 MED ORDER — FENTANYL CITRATE (PF) 250 MCG/5ML IJ SOLN
INTRAMUSCULAR | Status: DC | PRN
  Administered 2018-08-31: 25 ug via INTRAVENOUS
  Administered 2018-08-31: 75 ug via INTRAVENOUS

## 2018-08-31 MED ORDER — PROPOFOL 10 MG/ML IV BOLUS
INTRAVENOUS | Status: AC
  Filled 2018-08-31: qty 20

## 2018-08-31 MED ORDER — BUPIVACAINE HCL 0.5 % IJ SOLN
INTRAMUSCULAR | Status: DC | PRN
  Administered 2018-08-31: 9 mL

## 2018-08-31 MED ORDER — FENTANYL CITRATE (PF) 100 MCG/2ML IJ SOLN
INTRAMUSCULAR | Status: AC
  Filled 2018-08-31: qty 2

## 2018-08-31 MED ORDER — OXYCODONE HCL 5 MG/5ML PO SOLN
5.0000 mg | Freq: Once | ORAL | Status: DC | PRN
  Filled 2018-08-31: qty 5

## 2018-08-31 MED ORDER — OXYCODONE HCL 5 MG PO TABS
5.0000 mg | ORAL_TABLET | Freq: Once | ORAL | Status: DC | PRN
  Filled 2018-08-31: qty 1

## 2018-08-31 MED ORDER — FENTANYL CITRATE (PF) 100 MCG/2ML IJ SOLN
25.0000 ug | INTRAMUSCULAR | Status: DC | PRN
  Filled 2018-08-31: qty 1

## 2018-08-31 MED ORDER — CEFAZOLIN SODIUM-DEXTROSE 2-4 GM/100ML-% IV SOLN
2.0000 g | INTRAVENOUS | Status: AC
  Administered 2018-08-31: 2 g via INTRAVENOUS
  Filled 2018-08-31: qty 100

## 2018-08-31 MED ORDER — LABETALOL HCL 5 MG/ML IV SOLN
INTRAVENOUS | Status: AC
  Filled 2018-08-31: qty 4

## 2018-08-31 MED ORDER — CEFAZOLIN SODIUM-DEXTROSE 2-4 GM/100ML-% IV SOLN
INTRAVENOUS | Status: AC
  Filled 2018-08-31: qty 100

## 2018-08-31 MED ORDER — PROPOFOL 10 MG/ML IV BOLUS
INTRAVENOUS | Status: DC | PRN
  Administered 2018-08-31: 180 mg via INTRAVENOUS

## 2018-08-31 MED ORDER — ACETAMINOPHEN 325 MG PO TABS
650.0000 mg | ORAL_TABLET | ORAL | Status: DC | PRN
  Filled 2018-08-31: qty 2

## 2018-08-31 MED ORDER — SUCCINYLCHOLINE CHLORIDE 200 MG/10ML IV SOSY
PREFILLED_SYRINGE | INTRAVENOUS | Status: DC | PRN
  Administered 2018-08-31: 120 mg via INTRAVENOUS

## 2018-08-31 MED ORDER — ONDANSETRON HCL 4 MG/2ML IJ SOLN
4.0000 mg | Freq: Four times a day (QID) | INTRAMUSCULAR | Status: AC | PRN
  Administered 2018-08-31: 16:00:00 4 mg via INTRAVENOUS
  Filled 2018-08-31: qty 2

## 2018-08-31 MED ORDER — LIDOCAINE 2% (20 MG/ML) 5 ML SYRINGE
INTRAMUSCULAR | Status: DC | PRN
  Administered 2018-08-31: 50 mg via INTRAVENOUS

## 2018-08-31 MED ORDER — SODIUM CHLORIDE 0.9 % IV SOLN
INTRAVENOUS | Status: DC
  Administered 2018-08-31: 13:00:00 via INTRAVENOUS
  Filled 2018-08-31: qty 1000

## 2018-08-31 SURGICAL SUPPLY — 38 items
BANDAGE COBAN STERILE 2 (GAUZE/BANDAGES/DRESSINGS) ×3 IMPLANT
BENZOIN TINCTURE PRP APPL 2/3 (GAUZE/BANDAGES/DRESSINGS) ×3 IMPLANT
BLADE SURG 15 STRL LF DISP TIS (BLADE) ×2 IMPLANT
BLADE SURG 15 STRL SS (BLADE) ×4
BNDG GAUZE ELAST 4 BULKY (GAUZE/BANDAGES/DRESSINGS) ×3 IMPLANT
CLOSURE WOUND 1/4X4 (GAUZE/BANDAGES/DRESSINGS) ×1
COVER BACK TABLE 60X90IN (DRAPES) ×3 IMPLANT
COVER MAYO STAND STRL (DRAPES) ×3 IMPLANT
COVER WAND RF STERILE (DRAPES) ×3 IMPLANT
DRAPE EXTREMITY T 121X128X90 (DISPOSABLE) ×3 IMPLANT
DRSG EMULSION OIL 3X3 NADH (GAUZE/BANDAGES/DRESSINGS) ×3 IMPLANT
GAUZE SPONGE 4X4 12PLY STRL (GAUZE/BANDAGES/DRESSINGS) ×3 IMPLANT
GLOVE BIO SURGEON STRL SZ7.5 (GLOVE) ×6 IMPLANT
GLOVE BIOGEL PI IND STRL 7.0 (GLOVE) ×1 IMPLANT
GLOVE BIOGEL PI IND STRL 7.5 (GLOVE) ×2 IMPLANT
GLOVE BIOGEL PI INDICATOR 7.0 (GLOVE) ×2
GLOVE BIOGEL PI INDICATOR 7.5 (GLOVE) ×4
GOWN STRL REUS W/ TWL LRG LVL3 (GOWN DISPOSABLE) ×1 IMPLANT
GOWN STRL REUS W/ TWL XL LVL3 (GOWN DISPOSABLE) ×1 IMPLANT
GOWN STRL REUS W/TWL LRG LVL3 (GOWN DISPOSABLE) ×2
GOWN STRL REUS W/TWL XL LVL3 (GOWN DISPOSABLE) ×2
IMPL STRAVIX 3X6 (Tissue) ×1 IMPLANT
IMPLANT STRAVIX 3X6 (Tissue) ×3 IMPLANT
KIT TURNOVER CYSTO (KITS) ×3 IMPLANT
NEEDLE HYPO 22GX1.5 SAFETY (NEEDLE) IMPLANT
NEEDLE HYPO 25X1 1.5 SAFETY (NEEDLE) ×3 IMPLANT
PACK BASIN DAY SURGERY FS (CUSTOM PROCEDURE TRAY) ×3 IMPLANT
STOCKINETTE 4X48 STRL (DRAPES) ×3 IMPLANT
STOCKINETTE 6  STRL (DRAPES) ×2
STOCKINETTE 6 STRL (DRAPES) ×1 IMPLANT
STRIP CLOSURE SKIN 1/4X4 (GAUZE/BANDAGES/DRESSINGS) ×2 IMPLANT
SUT PROLENE 4 0 PS 2 18 (SUTURE) ×6 IMPLANT
SYR BULB 3OZ (MISCELLANEOUS) ×3 IMPLANT
SYR CONTROL 10ML LL (SYRINGE) ×3 IMPLANT
TRAY DSU PREP LF (CUSTOM PROCEDURE TRAY) ×3 IMPLANT
TUBE CONNECTING 12'X1/4 (SUCTIONS) ×1
TUBE CONNECTING 12X1/4 (SUCTIONS) ×2 IMPLANT
UNDERPAD 30X30 (UNDERPADS AND DIAPERS) ×3 IMPLANT

## 2018-08-31 NOTE — Progress Notes (Signed)
Blood sugar 249 per patient's glucose monitor. Dr. Marcie Bal in to see for preop.

## 2018-08-31 NOTE — Progress Notes (Signed)
Patient presents today for treatment to left heel ulceration with preparation of wound bed and application of Stravix graft.  No changes in history.  No guarantees given and patient wants to proceed with care.

## 2018-08-31 NOTE — Progress Notes (Signed)
Dr. Marcie Bal aware of blood sugar 300 on ISTAT 8. Insulin pump intact at 1 unit per hour at this time. May bolus per insulin pump parameters. Pt bolused 2.1 units.

## 2018-08-31 NOTE — Op Note (Signed)
NAME: Tony, Fisher MEDICAL RECORD [id number] ACCOUNT [id number] DATE OF BIRTH:1970-09-13 FACILITY: [facility] LOCATION: [facility] PHYSICIAN:Ivar Domangue Fritzi Mandes, DPM  OPERATIVE REPORT  DATE OF PROCEDURE:  08/31/2018  SURGEON:  Twanna Hy. Fritzi Mandes, DPM  ASSISTANT:  None.  PREOPERATIVE DIAGNOSIS:  Chronic ulceration of the heel, left foot.  POSTOPERATIVE DIAGNOSIS:  Chronic ulceration of the heel, left foot.    PROCEDURE:  Preparation of left wound bed and application of Stravix graft.    ANESTHESIA:  General.  DESCRIPTION OF PROCEDURE:  The patient was brought to the OR and placed in the supine position, at which time general anesthesia was administered because he was then put in a prone position.  The patient was prepped and draped in the usual aseptic  manner.  Attention was directed to the left heel where photographs were taken, measurements were taken and the wound measured 3.2 cm in length and 3.2 cm in width.  The patient has a history of chronic wound secondary to diabetes and has failed to  respond to good standard wound care, which included debridement of the wound bed and advanced dressings, collagen, Grafix.  Therefore, use of the advanced therapies was indicated at this time.  Underlying disease is being treated by a physician.  The  patient is being offloaded for the diabetic foot ulcers.  No signs of infection or abscesses were noted, so the absence of acute wound infection and absence of osteomyelitis.  The patient is adequately nourished for wound healing.  The patient presents  with a sterile application of a 3 cm x 6 cm piece of Stravix.  The foot was prepped and attention directed to the wound site.  The wound site was surgically debrided and the wound bed excised to full thickness and the wound bed with a 15 blade to prepare  the wound site for the graft tissue.  All necrotic and viable tissue was removed from the procedural field.  Care was taken to assure the best wound  bed for application of the graft.  No sign of infection or abscess was noted.  The wound was flushed  with sterile saline.  The sterile graft material was then removed from its package and placed in saline, and it was carefully applied to the wound base using forceps.  At this point, great care was to make sure the graft was in full contact with the  wound base and 4.0 Prolene was used to suture the graft around the perimeter.  No seroma or hematoma was noted with the application.  The Prolene was used to anchor the graft and secure in position and complete contact was achieved.  Then, Adaptic was  applied and placed over the graft to maintain moisture and prevent infection.  Fluffs, 4 x 4s, Kerlix and Coban were applied.  The patient was then sent to the recovery room with vital signs stable and capillary refill time at presurgical levels.  No  guarantees were given.  He is to remain absolutely nonweightbearing.  He is to follow up in 7 days.  TN/NUANCE  D:08/31/2018 T:08/31/2018 [id number]

## 2018-08-31 NOTE — Anesthesia Procedure Notes (Signed)
Procedure Name: Intubation Date/Time: 08/31/2018 2:08 PM Performed by: Myna Bright, CRNA Pre-anesthesia Checklist: Emergency Drugs available, Suction available, Patient being monitored and Patient identified Patient Re-evaluated:Patient Re-evaluated prior to induction Oxygen Delivery Method: Circle system utilized Preoxygenation: Pre-oxygenation with 100% oxygen Induction Type: IV induction Ventilation: Mask ventilation without difficulty Laryngoscope Size: Mac and 4 Grade View: Grade II Tube type: Oral Tube size: 7.0 mm Number of attempts: 1 Airway Equipment and Method: Stylet Placement Confirmation: ETT inserted through vocal cords under direct vision,  positive ETCO2 and breath sounds checked- equal and bilateral Secured at: 22 cm Tube secured with: Tape Dental Injury: Teeth and Oropharynx as per pre-operative assessment

## 2018-08-31 NOTE — Transfer of Care (Signed)
Immediate Anesthesia Transfer of Care Note  Patient: Tony Fisher  Procedure(s) Performed: PREPARATION WOUND LEFT FOOT (Left Foot) STRAVIX GRAFT APPLICATION (Left Foot)  Patient Location: PACU  Anesthesia Type:General  Level of Consciousness: awake, alert , oriented and patient cooperative  Airway & Oxygen Therapy: Patient Spontanous Breathing and Patient connected to nasal cannula oxygen  Post-op Assessment: Report given to RN, Post -op Vital signs reviewed and stable and Patient moving all extremities  Post vital signs: Reviewed and stable  Last Vitals:  Vitals Value Taken Time  BP    Temp    Pulse    Resp    SpO2      Last Pain:  Vitals:   08/31/18 1209  TempSrc: Oral         Complications: No apparent anesthesia complications

## 2018-08-31 NOTE — Anesthesia Postprocedure Evaluation (Signed)
Anesthesia Post Note  Patient: MELBERT BOTELHO  Procedure(s) Performed: PREPARATION WOUND LEFT FOOT (Left Foot) STRAVIX GRAFT APPLICATION (Left Foot)     Patient location during evaluation: PACU Anesthesia Type: General Level of consciousness: awake and alert and oriented Pain management: pain level controlled Vital Signs Assessment: post-procedure vital signs reviewed and stable Respiratory status: spontaneous breathing, nonlabored ventilation and respiratory function stable Cardiovascular status: blood pressure returned to baseline and stable Postop Assessment: no apparent nausea or vomiting Anesthetic complications: no Comments: Given Labetalol for HTN in PACU    Last Vitals:  Vitals:   08/31/18 1600 08/31/18 1645  BP: (!) 165/102 (!) 152/99  Pulse: 85 86  Resp: 18 10  Temp:    SpO2: 100% 99%    Last Pain:  Vitals:   08/31/18 1645  TempSrc:   PainSc: 0-No pain                 Jamone Garrido A.

## 2018-08-31 NOTE — Discharge Instructions (Signed)
  Post Anesthesia Home Care Instructions  Activity: Get plenty of rest for the remainder of the day. A responsible adult should stay with you for 24 hours following the procedure.  For the next 24 hours, DO NOT: -Drive a car -Operate machinery -Drink alcoholic beverages -Take any medication unless instructed by your physician -Make any legal decisions or sign important papers.  Meals: Start with liquid foods such as gelatin or soup. Progress to regular foods as tolerated. Avoid greasy, spicy, heavy foods. If nausea and/or vomiting occur, drink only clear liquids until the nausea and/or vomiting subsides. Call your physician if vomiting continues.  Special Instructions/Symptoms: Your throat may feel dry or sore from the anesthesia or the breathing tube placed in your throat during surgery. If this causes discomfort, gargle with warm salt water. The discomfort should disappear within 24 hours.  If you had a scopolamine patch placed behind your ear for the management of post- operative nausea and/or vomiting:  1. The medication in the patch is effective for 72 hours, after which it should be removed.  Wrap patch in a tissue and discard in the trash. Wash hands thoroughly with soap and water. 2. You may remove the patch earlier than 72 hours if you experience unpleasant side effects which may include dry mouth, dizziness or visual disturbances. 3. Avoid touching the patch. Wash your hands with soap and water after contact with the patch.    Post Anesthesia Home Care Instructions  Activity: Get plenty of rest for the remainder of the day. A responsible adult should stay with you for 24 hours following the procedure.  For the next 24 hours, DO NOT: -Drive a car -Operate machinery -Drink alcoholic beverages -Take any medication unless instructed by your physician -Make any legal decisions or sign important papers.  Meals: Start with liquid foods such as gelatin or soup. Progress to  regular foods as tolerated. Avoid greasy, spicy, heavy foods. If nausea and/or vomiting occur, drink only clear liquids until the nausea and/or vomiting subsides. Call your physician if vomiting continues.  Special Instructions/Symptoms: Your throat may feel dry or sore from the anesthesia or the breathing tube placed in your throat during surgery. If this causes discomfort, gargle with warm salt water. The discomfort should disappear within 24 hours.  If you had a scopolamine patch placed behind your ear for the management of post- operative nausea and/or vomiting:  1. The medication in the patch is effective for 72 hours, after which it should be removed.  Wrap patch in a tissue and discard in the trash. Wash hands thoroughly with soap and water. 2. You may remove the patch earlier than 72 hours if you experience unpleasant side effects which may include dry mouth, dizziness or visual disturbances. 3. Avoid touching the patch. Wash your hands with soap and water after contact with the patch.    

## 2018-09-03 ENCOUNTER — Encounter (HOSPITAL_BASED_OUTPATIENT_CLINIC_OR_DEPARTMENT_OTHER): Payer: Self-pay | Admitting: Podiatry

## 2018-09-06 DIAGNOSIS — E1129 Type 2 diabetes mellitus with other diabetic kidney complication: Secondary | ICD-10-CM | POA: Diagnosis not present

## 2018-09-06 DIAGNOSIS — Z89519 Acquired absence of unspecified leg below knee: Secondary | ICD-10-CM | POA: Diagnosis not present

## 2018-09-06 DIAGNOSIS — L97522 Non-pressure chronic ulcer of other part of left foot with fat layer exposed: Secondary | ICD-10-CM | POA: Diagnosis not present

## 2018-09-06 DIAGNOSIS — E1040 Type 1 diabetes mellitus with diabetic neuropathy, unspecified: Secondary | ICD-10-CM | POA: Diagnosis not present

## 2018-09-13 DIAGNOSIS — L03032 Cellulitis of left toe: Secondary | ICD-10-CM | POA: Diagnosis not present

## 2018-09-13 DIAGNOSIS — B351 Tinea unguium: Secondary | ICD-10-CM | POA: Diagnosis not present

## 2018-09-13 DIAGNOSIS — Z89519 Acquired absence of unspecified leg below knee: Secondary | ICD-10-CM | POA: Diagnosis not present

## 2018-09-13 DIAGNOSIS — L97522 Non-pressure chronic ulcer of other part of left foot with fat layer exposed: Secondary | ICD-10-CM | POA: Diagnosis not present

## 2018-09-13 DIAGNOSIS — E1040 Type 1 diabetes mellitus with diabetic neuropathy, unspecified: Secondary | ICD-10-CM | POA: Diagnosis not present

## 2018-09-21 DIAGNOSIS — L97522 Non-pressure chronic ulcer of other part of left foot with fat layer exposed: Secondary | ICD-10-CM | POA: Diagnosis not present

## 2018-09-21 DIAGNOSIS — Z89519 Acquired absence of unspecified leg below knee: Secondary | ICD-10-CM | POA: Diagnosis not present

## 2018-09-21 DIAGNOSIS — E1040 Type 1 diabetes mellitus with diabetic neuropathy, unspecified: Secondary | ICD-10-CM | POA: Diagnosis not present

## 2018-09-25 DIAGNOSIS — Z79899 Other long term (current) drug therapy: Secondary | ICD-10-CM | POA: Diagnosis not present

## 2018-09-25 DIAGNOSIS — E1021 Type 1 diabetes mellitus with diabetic nephropathy: Secondary | ICD-10-CM | POA: Diagnosis not present

## 2018-09-25 DIAGNOSIS — Z4822 Encounter for aftercare following kidney transplant: Secondary | ICD-10-CM | POA: Diagnosis not present

## 2018-09-25 DIAGNOSIS — E10319 Type 1 diabetes mellitus with unspecified diabetic retinopathy without macular edema: Secondary | ICD-10-CM | POA: Diagnosis not present

## 2018-09-25 DIAGNOSIS — Z8631 Personal history of diabetic foot ulcer: Secondary | ICD-10-CM | POA: Diagnosis not present

## 2018-09-25 DIAGNOSIS — E1042 Type 1 diabetes mellitus with diabetic polyneuropathy: Secondary | ICD-10-CM | POA: Diagnosis not present

## 2018-09-25 DIAGNOSIS — Z89511 Acquired absence of right leg below knee: Secondary | ICD-10-CM | POA: Diagnosis not present

## 2018-09-25 DIAGNOSIS — Z794 Long term (current) use of insulin: Secondary | ICD-10-CM | POA: Diagnosis not present

## 2018-09-27 DIAGNOSIS — E1040 Type 1 diabetes mellitus with diabetic neuropathy, unspecified: Secondary | ICD-10-CM | POA: Diagnosis not present

## 2018-09-27 DIAGNOSIS — Z89519 Acquired absence of unspecified leg below knee: Secondary | ICD-10-CM | POA: Diagnosis not present

## 2018-09-27 DIAGNOSIS — L97522 Non-pressure chronic ulcer of other part of left foot with fat layer exposed: Secondary | ICD-10-CM | POA: Diagnosis not present

## 2018-10-04 DIAGNOSIS — E1040 Type 1 diabetes mellitus with diabetic neuropathy, unspecified: Secondary | ICD-10-CM | POA: Diagnosis not present

## 2018-10-04 DIAGNOSIS — L97522 Non-pressure chronic ulcer of other part of left foot with fat layer exposed: Secondary | ICD-10-CM | POA: Diagnosis not present

## 2018-10-04 DIAGNOSIS — Z89519 Acquired absence of unspecified leg below knee: Secondary | ICD-10-CM | POA: Diagnosis not present

## 2018-10-11 DIAGNOSIS — E1040 Type 1 diabetes mellitus with diabetic neuropathy, unspecified: Secondary | ICD-10-CM | POA: Diagnosis not present

## 2018-10-11 DIAGNOSIS — Z89519 Acquired absence of unspecified leg below knee: Secondary | ICD-10-CM | POA: Diagnosis not present

## 2018-10-11 DIAGNOSIS — L97522 Non-pressure chronic ulcer of other part of left foot with fat layer exposed: Secondary | ICD-10-CM | POA: Diagnosis not present

## 2018-10-25 DIAGNOSIS — E1040 Type 1 diabetes mellitus with diabetic neuropathy, unspecified: Secondary | ICD-10-CM | POA: Diagnosis not present

## 2018-10-25 DIAGNOSIS — L97522 Non-pressure chronic ulcer of other part of left foot with fat layer exposed: Secondary | ICD-10-CM | POA: Diagnosis not present

## 2018-10-25 DIAGNOSIS — Z89519 Acquired absence of unspecified leg below knee: Secondary | ICD-10-CM | POA: Diagnosis not present

## 2018-10-31 DIAGNOSIS — E1040 Type 1 diabetes mellitus with diabetic neuropathy, unspecified: Secondary | ICD-10-CM | POA: Diagnosis not present

## 2018-10-31 DIAGNOSIS — Z89519 Acquired absence of unspecified leg below knee: Secondary | ICD-10-CM | POA: Diagnosis not present

## 2018-10-31 DIAGNOSIS — L97522 Non-pressure chronic ulcer of other part of left foot with fat layer exposed: Secondary | ICD-10-CM | POA: Diagnosis not present

## 2018-11-08 DIAGNOSIS — Z89519 Acquired absence of unspecified leg below knee: Secondary | ICD-10-CM | POA: Diagnosis not present

## 2018-11-08 DIAGNOSIS — L97522 Non-pressure chronic ulcer of other part of left foot with fat layer exposed: Secondary | ICD-10-CM | POA: Diagnosis not present

## 2018-11-08 DIAGNOSIS — E1040 Type 1 diabetes mellitus with diabetic neuropathy, unspecified: Secondary | ICD-10-CM | POA: Diagnosis not present

## 2018-11-22 DIAGNOSIS — E1040 Type 1 diabetes mellitus with diabetic neuropathy, unspecified: Secondary | ICD-10-CM | POA: Diagnosis not present

## 2018-11-22 DIAGNOSIS — L97522 Non-pressure chronic ulcer of other part of left foot with fat layer exposed: Secondary | ICD-10-CM | POA: Diagnosis not present

## 2018-11-29 DIAGNOSIS — L97522 Non-pressure chronic ulcer of other part of left foot with fat layer exposed: Secondary | ICD-10-CM | POA: Diagnosis not present

## 2018-11-29 DIAGNOSIS — E1040 Type 1 diabetes mellitus with diabetic neuropathy, unspecified: Secondary | ICD-10-CM | POA: Diagnosis not present

## 2018-11-29 DIAGNOSIS — Z89519 Acquired absence of unspecified leg below knee: Secondary | ICD-10-CM | POA: Diagnosis not present

## 2018-12-06 DIAGNOSIS — E1040 Type 1 diabetes mellitus with diabetic neuropathy, unspecified: Secondary | ICD-10-CM | POA: Diagnosis not present

## 2018-12-06 DIAGNOSIS — L97522 Non-pressure chronic ulcer of other part of left foot with fat layer exposed: Secondary | ICD-10-CM | POA: Diagnosis not present

## 2018-12-06 DIAGNOSIS — Z89519 Acquired absence of unspecified leg below knee: Secondary | ICD-10-CM | POA: Diagnosis not present

## 2018-12-13 DIAGNOSIS — Z89519 Acquired absence of unspecified leg below knee: Secondary | ICD-10-CM | POA: Diagnosis not present

## 2018-12-13 DIAGNOSIS — E1040 Type 1 diabetes mellitus with diabetic neuropathy, unspecified: Secondary | ICD-10-CM | POA: Diagnosis not present

## 2018-12-13 DIAGNOSIS — L97522 Non-pressure chronic ulcer of other part of left foot with fat layer exposed: Secondary | ICD-10-CM | POA: Diagnosis not present

## 2018-12-25 DIAGNOSIS — Z23 Encounter for immunization: Secondary | ICD-10-CM | POA: Diagnosis not present

## 2018-12-25 DIAGNOSIS — D631 Anemia in chronic kidney disease: Secondary | ICD-10-CM | POA: Diagnosis not present

## 2018-12-25 DIAGNOSIS — Z940 Kidney transplant status: Secondary | ICD-10-CM | POA: Diagnosis not present

## 2018-12-25 DIAGNOSIS — E10621 Type 1 diabetes mellitus with foot ulcer: Secondary | ICD-10-CM | POA: Diagnosis not present

## 2018-12-25 DIAGNOSIS — I739 Peripheral vascular disease, unspecified: Secondary | ICD-10-CM | POA: Diagnosis not present

## 2018-12-25 DIAGNOSIS — N189 Chronic kidney disease, unspecified: Secondary | ICD-10-CM | POA: Diagnosis not present

## 2018-12-25 DIAGNOSIS — E11319 Type 2 diabetes mellitus with unspecified diabetic retinopathy without macular edema: Secondary | ICD-10-CM | POA: Diagnosis not present

## 2018-12-25 DIAGNOSIS — Z9225 Personal history of immunosupression therapy: Secondary | ICD-10-CM | POA: Diagnosis not present

## 2018-12-25 DIAGNOSIS — I129 Hypertensive chronic kidney disease with stage 1 through stage 4 chronic kidney disease, or unspecified chronic kidney disease: Secondary | ICD-10-CM | POA: Diagnosis not present

## 2018-12-25 DIAGNOSIS — Z9483 Pancreas transplant status: Secondary | ICD-10-CM | POA: Diagnosis not present

## 2018-12-25 DIAGNOSIS — E1122 Type 2 diabetes mellitus with diabetic chronic kidney disease: Secondary | ICD-10-CM | POA: Diagnosis not present

## 2018-12-25 DIAGNOSIS — N2581 Secondary hyperparathyroidism of renal origin: Secondary | ICD-10-CM | POA: Diagnosis not present

## 2018-12-26 DIAGNOSIS — L97522 Non-pressure chronic ulcer of other part of left foot with fat layer exposed: Secondary | ICD-10-CM | POA: Diagnosis not present

## 2018-12-26 DIAGNOSIS — Z89519 Acquired absence of unspecified leg below knee: Secondary | ICD-10-CM | POA: Diagnosis not present

## 2018-12-26 DIAGNOSIS — E1040 Type 1 diabetes mellitus with diabetic neuropathy, unspecified: Secondary | ICD-10-CM | POA: Diagnosis not present

## 2019-01-01 DIAGNOSIS — N186 End stage renal disease: Secondary | ICD-10-CM | POA: Diagnosis not present

## 2019-01-01 DIAGNOSIS — Z940 Kidney transplant status: Secondary | ICD-10-CM | POA: Diagnosis not present

## 2019-01-01 DIAGNOSIS — Z9641 Presence of insulin pump (external) (internal): Secondary | ICD-10-CM | POA: Diagnosis not present

## 2019-01-01 DIAGNOSIS — E1040 Type 1 diabetes mellitus with diabetic neuropathy, unspecified: Secondary | ICD-10-CM | POA: Diagnosis not present

## 2019-01-01 DIAGNOSIS — E10319 Type 1 diabetes mellitus with unspecified diabetic retinopathy without macular edema: Secondary | ICD-10-CM | POA: Diagnosis not present

## 2019-01-01 DIAGNOSIS — E1021 Type 1 diabetes mellitus with diabetic nephropathy: Secondary | ICD-10-CM | POA: Diagnosis not present

## 2019-01-01 DIAGNOSIS — E1022 Type 1 diabetes mellitus with diabetic chronic kidney disease: Secondary | ICD-10-CM | POA: Diagnosis not present

## 2019-01-10 DIAGNOSIS — Z89519 Acquired absence of unspecified leg below knee: Secondary | ICD-10-CM | POA: Diagnosis not present

## 2019-01-10 DIAGNOSIS — L97522 Non-pressure chronic ulcer of other part of left foot with fat layer exposed: Secondary | ICD-10-CM | POA: Diagnosis not present

## 2019-01-10 DIAGNOSIS — E1040 Type 1 diabetes mellitus with diabetic neuropathy, unspecified: Secondary | ICD-10-CM | POA: Diagnosis not present

## 2019-01-10 DIAGNOSIS — B351 Tinea unguium: Secondary | ICD-10-CM | POA: Diagnosis not present

## 2019-01-10 DIAGNOSIS — L03032 Cellulitis of left toe: Secondary | ICD-10-CM | POA: Diagnosis not present

## 2019-02-14 DIAGNOSIS — E1040 Type 1 diabetes mellitus with diabetic neuropathy, unspecified: Secondary | ICD-10-CM | POA: Diagnosis not present

## 2019-02-14 DIAGNOSIS — L97522 Non-pressure chronic ulcer of other part of left foot with fat layer exposed: Secondary | ICD-10-CM | POA: Diagnosis not present

## 2019-02-14 DIAGNOSIS — E1129 Type 2 diabetes mellitus with other diabetic kidney complication: Secondary | ICD-10-CM | POA: Diagnosis not present

## 2019-02-14 DIAGNOSIS — Z89519 Acquired absence of unspecified leg below knee: Secondary | ICD-10-CM | POA: Diagnosis not present

## 2019-02-28 DIAGNOSIS — E1040 Type 1 diabetes mellitus with diabetic neuropathy, unspecified: Secondary | ICD-10-CM | POA: Diagnosis not present

## 2019-02-28 DIAGNOSIS — B351 Tinea unguium: Secondary | ICD-10-CM | POA: Diagnosis not present

## 2019-03-14 DIAGNOSIS — Z89519 Acquired absence of unspecified leg below knee: Secondary | ICD-10-CM | POA: Diagnosis not present

## 2019-03-14 DIAGNOSIS — E1040 Type 1 diabetes mellitus with diabetic neuropathy, unspecified: Secondary | ICD-10-CM | POA: Diagnosis not present

## 2019-03-14 DIAGNOSIS — L97522 Non-pressure chronic ulcer of other part of left foot with fat layer exposed: Secondary | ICD-10-CM | POA: Diagnosis not present

## 2019-03-20 DIAGNOSIS — L97522 Non-pressure chronic ulcer of other part of left foot with fat layer exposed: Secondary | ICD-10-CM | POA: Diagnosis not present

## 2019-03-20 DIAGNOSIS — E1040 Type 1 diabetes mellitus with diabetic neuropathy, unspecified: Secondary | ICD-10-CM | POA: Diagnosis not present

## 2019-03-20 DIAGNOSIS — Z89519 Acquired absence of unspecified leg below knee: Secondary | ICD-10-CM | POA: Diagnosis not present

## 2019-03-28 DIAGNOSIS — N319 Neuromuscular dysfunction of bladder, unspecified: Secondary | ICD-10-CM | POA: Diagnosis not present

## 2019-03-28 DIAGNOSIS — I10 Essential (primary) hypertension: Secondary | ICD-10-CM | POA: Diagnosis not present

## 2019-03-28 DIAGNOSIS — E109 Type 1 diabetes mellitus without complications: Secondary | ICD-10-CM | POA: Diagnosis not present

## 2019-03-28 DIAGNOSIS — L97519 Non-pressure chronic ulcer of other part of right foot with unspecified severity: Secondary | ICD-10-CM | POA: Diagnosis not present

## 2019-03-28 DIAGNOSIS — Z4822 Encounter for aftercare following kidney transplant: Secondary | ICD-10-CM | POA: Diagnosis not present

## 2019-03-28 DIAGNOSIS — Z940 Kidney transplant status: Secondary | ICD-10-CM | POA: Diagnosis not present

## 2019-03-28 DIAGNOSIS — Z9641 Presence of insulin pump (external) (internal): Secondary | ICD-10-CM | POA: Diagnosis not present

## 2019-03-28 DIAGNOSIS — Z7952 Long term (current) use of systemic steroids: Secondary | ICD-10-CM | POA: Diagnosis not present

## 2019-03-28 DIAGNOSIS — Z79899 Other long term (current) drug therapy: Secondary | ICD-10-CM | POA: Diagnosis not present

## 2019-03-28 DIAGNOSIS — Z8679 Personal history of other diseases of the circulatory system: Secondary | ICD-10-CM | POA: Diagnosis not present

## 2019-04-04 DIAGNOSIS — E1040 Type 1 diabetes mellitus with diabetic neuropathy, unspecified: Secondary | ICD-10-CM | POA: Diagnosis not present

## 2019-04-04 DIAGNOSIS — L97522 Non-pressure chronic ulcer of other part of left foot with fat layer exposed: Secondary | ICD-10-CM | POA: Diagnosis not present

## 2019-04-09 DIAGNOSIS — Z9641 Presence of insulin pump (external) (internal): Secondary | ICD-10-CM | POA: Diagnosis not present

## 2019-04-09 DIAGNOSIS — Z89511 Acquired absence of right leg below knee: Secondary | ICD-10-CM | POA: Diagnosis not present

## 2019-04-09 DIAGNOSIS — E1021 Type 1 diabetes mellitus with diabetic nephropathy: Secondary | ICD-10-CM | POA: Diagnosis not present

## 2019-04-09 DIAGNOSIS — Z4822 Encounter for aftercare following kidney transplant: Secondary | ICD-10-CM | POA: Diagnosis not present

## 2019-04-09 DIAGNOSIS — Z79899 Other long term (current) drug therapy: Secondary | ICD-10-CM | POA: Diagnosis not present

## 2019-04-09 DIAGNOSIS — E10319 Type 1 diabetes mellitus with unspecified diabetic retinopathy without macular edema: Secondary | ICD-10-CM | POA: Diagnosis not present

## 2019-04-11 DIAGNOSIS — L97429 Non-pressure chronic ulcer of left heel and midfoot with unspecified severity: Secondary | ICD-10-CM | POA: Diagnosis not present

## 2019-04-11 DIAGNOSIS — L97522 Non-pressure chronic ulcer of other part of left foot with fat layer exposed: Secondary | ICD-10-CM | POA: Diagnosis not present

## 2019-04-19 DIAGNOSIS — L97522 Non-pressure chronic ulcer of other part of left foot with fat layer exposed: Secondary | ICD-10-CM | POA: Diagnosis not present

## 2019-04-19 DIAGNOSIS — E1040 Type 1 diabetes mellitus with diabetic neuropathy, unspecified: Secondary | ICD-10-CM | POA: Diagnosis not present

## 2019-04-23 DIAGNOSIS — E1122 Type 2 diabetes mellitus with diabetic chronic kidney disease: Secondary | ICD-10-CM | POA: Diagnosis not present

## 2019-04-23 DIAGNOSIS — D631 Anemia in chronic kidney disease: Secondary | ICD-10-CM | POA: Diagnosis not present

## 2019-04-23 DIAGNOSIS — Z940 Kidney transplant status: Secondary | ICD-10-CM | POA: Diagnosis not present

## 2019-04-23 DIAGNOSIS — Z9483 Pancreas transplant status: Secondary | ICD-10-CM | POA: Diagnosis not present

## 2019-04-23 DIAGNOSIS — E10621 Type 1 diabetes mellitus with foot ulcer: Secondary | ICD-10-CM | POA: Diagnosis not present

## 2019-04-23 DIAGNOSIS — Z9225 Personal history of immunosupression therapy: Secondary | ICD-10-CM | POA: Diagnosis not present

## 2019-04-23 DIAGNOSIS — N2581 Secondary hyperparathyroidism of renal origin: Secondary | ICD-10-CM | POA: Diagnosis not present

## 2019-04-23 DIAGNOSIS — N189 Chronic kidney disease, unspecified: Secondary | ICD-10-CM | POA: Diagnosis not present

## 2019-04-23 DIAGNOSIS — K219 Gastro-esophageal reflux disease without esophagitis: Secondary | ICD-10-CM | POA: Diagnosis not present

## 2019-04-23 DIAGNOSIS — I739 Peripheral vascular disease, unspecified: Secondary | ICD-10-CM | POA: Diagnosis not present

## 2019-04-23 DIAGNOSIS — I129 Hypertensive chronic kidney disease with stage 1 through stage 4 chronic kidney disease, or unspecified chronic kidney disease: Secondary | ICD-10-CM | POA: Diagnosis not present

## 2019-04-23 DIAGNOSIS — E11319 Type 2 diabetes mellitus with unspecified diabetic retinopathy without macular edema: Secondary | ICD-10-CM | POA: Diagnosis not present

## 2019-04-24 ENCOUNTER — Encounter: Payer: Self-pay | Admitting: Internal Medicine

## 2019-04-24 ENCOUNTER — Other Ambulatory Visit: Payer: Self-pay

## 2019-04-24 ENCOUNTER — Ambulatory Visit (INDEPENDENT_AMBULATORY_CARE_PROVIDER_SITE_OTHER): Payer: Medicare Other | Admitting: Internal Medicine

## 2019-04-24 DIAGNOSIS — L98499 Non-pressure chronic ulcer of skin of other sites with unspecified severity: Secondary | ICD-10-CM | POA: Diagnosis not present
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
## 2019-04-25 DIAGNOSIS — E1040 Type 1 diabetes mellitus with diabetic neuropathy, unspecified: Secondary | ICD-10-CM | POA: Diagnosis not present

## 2019-04-25 DIAGNOSIS — Z89519 Acquired absence of unspecified leg below knee: Secondary | ICD-10-CM | POA: Diagnosis not present

## 2019-04-25 DIAGNOSIS — L97522 Non-pressure chronic ulcer of other part of left foot with fat layer exposed: Secondary | ICD-10-CM | POA: Diagnosis not present

## 2019-04-25 DIAGNOSIS — R2689 Other abnormalities of gait and mobility: Secondary | ICD-10-CM | POA: Diagnosis not present

## 2019-04-25 DIAGNOSIS — M21962 Unspecified acquired deformity of left lower leg: Secondary | ICD-10-CM | POA: Diagnosis not present

## 2019-05-07 ENCOUNTER — Other Ambulatory Visit: Payer: Self-pay

## 2019-05-07 ENCOUNTER — Other Ambulatory Visit (HOSPITAL_COMMUNITY)
Admission: RE | Admit: 2019-05-07 | Discharge: 2019-05-07 | Disposition: A | Discharge status: Home / Self Care | Payer: Medicare Other | Source: Ambulatory Visit | Attending: Podiatry | Admitting: Podiatry

## 2019-05-07 ENCOUNTER — Encounter (HOSPITAL_BASED_OUTPATIENT_CLINIC_OR_DEPARTMENT_OTHER): Payer: Self-pay | Admitting: Podiatry

## 2019-05-07 DIAGNOSIS — Z20822 Contact with and (suspected) exposure to covid-19: Secondary | ICD-10-CM | POA: Insufficient documentation

## 2019-05-07 DIAGNOSIS — Z01812 Encounter for preprocedural laboratory examination: Secondary | ICD-10-CM | POA: Diagnosis not present

## 2019-05-07 LAB — SARS CORONAVIRUS 2 (TAT 6-24 HRS): SARS Coronavirus 2: NEGATIVE

## 2019-05-07 NOTE — Progress Notes (Addendum)
ADDENDUM:  Received pt's pcp, dr Edrick Oh, H&P dated 04-23-2019 via fax and faxed to [facility], to be placed on chart.  ADDENDUM:   Called and spoke w/ Jonna Coup, office staff @ Dr Fritzi Mandes, stated she had to re-fax h&p form to pt's pcp , she now waiting on them to fax it.  ADDENDUM:  Called Dr Fritzi Mandes office to check on status of H&P.  Per [place], office staff, stated they had not received as of yet today.  Per [place], she called and pt a message for him to call his pcp office and get them to fax ASAP.  Called and spoke w/ pt due to needing him to arrive at 1300 and clear liquids until 0900, pt verbalized understanding.   ADDENDUM:  Chart reviewed by anesthesia, Konrad Felix PA, with lov note from dr Hassell Done webb dated 04-23-2019 (that was received via fax ) ok to proceed.   Spoke w/ via phone for pre-op interview--- PT Lab needs dos----  Istat 8            Lab results------ A1c 7.5 result dated in care everywhere 03-28-2019 COVID test ------ 05-07-2019 @ 1450 Arrive at ------- 1330 NPO after ------ MN w/ clear liquids until 0930 then nothing by mouth (no cream/milk products) Medications to take morning of surgery ----- AM meds w/ sips of water Diabetic medication ----- pt has insulin pump w/ novol0g, pt to leave until arrival [facility] Patient Special Instructions ----- n/a Pre-Op special Istructions ----- pt has a Dexcom on arm Patient verbalized understanding of instructions that were given at this phone interview. Patient denies shortness of breath, chest pain, fever, cough a this phone interview.   Anesthesia Review:  CKD III, Type 1 DM w/ insulin pump,  Hx ESRD s/p transplant twice last one 02-25-2015.  PAOD hx s/p right BKA. Chart to be reviewed by anestheisa.  PCP/ Nephrologist:  Dr Edrick Oh (lov 04-23-2019 per pt)  Called and requested note  Transplant:  Dr Kenney Houseman @[facility]  (lov 03-28-2019 care everywhere)  Endocrinologist:   Dr Nicoletta Dress Cassell Clement 04-09-2019 care  everywhere)  Cardiologist :  Dr Gwenlyn Found for PAOD Cassell Clement 07-11-2017, pt released on prn basis) Chest x-ray :  03-10-2017 care everywhere EKG :  08-31-2018 epic Echo :  04-12-2013 care everywhere Stress echo:  04-12-2013 care everywhere Cardiac Cath :  no Sleep Study/ CPAP : YES/ NO  Fasting Blood Sugar :  105 - 120    / Checks Blood Sugar -- times a day:  Multiple times daily w/ Dexcom G6  Blood Thinner/ Instructions Maryjane Hurter Dose: NO ASA / Instructions/ Last Dose :  ASA 81mg /  Per pt told not to stop ASA by dr Fritzi Mandes office

## 2019-05-10 ENCOUNTER — Encounter (HOSPITAL_BASED_OUTPATIENT_CLINIC_OR_DEPARTMENT_OTHER)
Admission: RE | Disposition: A | Discharge status: Home / Self Care | Payer: Self-pay | Source: Home / Self Care | Attending: Podiatry

## 2019-05-10 ENCOUNTER — Ambulatory Visit (HOSPITAL_BASED_OUTPATIENT_CLINIC_OR_DEPARTMENT_OTHER): Payer: Medicare Other | Admitting: Physician Assistant

## 2019-05-10 ENCOUNTER — Other Ambulatory Visit: Payer: Self-pay

## 2019-05-10 ENCOUNTER — Ambulatory Visit (HOSPITAL_BASED_OUTPATIENT_CLINIC_OR_DEPARTMENT_OTHER)
Admission: RE | Admit: 2019-05-10 | Discharge: 2019-05-10 | Disposition: A | Discharge status: Home / Self Care | Payer: Medicare Other | Attending: Podiatry | Admitting: Podiatry

## 2019-05-10 ENCOUNTER — Encounter (HOSPITAL_BASED_OUTPATIENT_CLINIC_OR_DEPARTMENT_OTHER): Payer: Self-pay | Admitting: Podiatry

## 2019-05-10 DIAGNOSIS — E10621 Type 1 diabetes mellitus with foot ulcer: Secondary | ICD-10-CM | POA: Insufficient documentation

## 2019-05-10 DIAGNOSIS — L97429 Non-pressure chronic ulcer of left heel and midfoot with unspecified severity: Secondary | ICD-10-CM | POA: Insufficient documentation

## 2019-05-10 DIAGNOSIS — E1051 Type 1 diabetes mellitus with diabetic peripheral angiopathy without gangrene: Secondary | ICD-10-CM | POA: Diagnosis not present

## 2019-05-10 DIAGNOSIS — Z940 Kidney transplant status: Secondary | ICD-10-CM | POA: Insufficient documentation

## 2019-05-10 DIAGNOSIS — Z794 Long term (current) use of insulin: Secondary | ICD-10-CM | POA: Insufficient documentation

## 2019-05-10 DIAGNOSIS — L97522 Non-pressure chronic ulcer of other part of left foot with fat layer exposed: Secondary | ICD-10-CM | POA: Diagnosis not present

## 2019-05-10 DIAGNOSIS — K219 Gastro-esophageal reflux disease without esophagitis: Secondary | ICD-10-CM | POA: Diagnosis not present

## 2019-05-10 DIAGNOSIS — Z9641 Presence of insulin pump (external) (internal): Secondary | ICD-10-CM | POA: Insufficient documentation

## 2019-05-10 DIAGNOSIS — Z89511 Acquired absence of right leg below knee: Secondary | ICD-10-CM | POA: Insufficient documentation

## 2019-05-10 DIAGNOSIS — Z79899 Other long term (current) drug therapy: Secondary | ICD-10-CM | POA: Diagnosis not present

## 2019-05-10 DIAGNOSIS — Z7982 Long term (current) use of aspirin: Secondary | ICD-10-CM | POA: Diagnosis not present

## 2019-05-10 DIAGNOSIS — I10 Essential (primary) hypertension: Secondary | ICD-10-CM | POA: Insufficient documentation

## 2019-05-10 DIAGNOSIS — D631 Anemia in chronic kidney disease: Secondary | ICD-10-CM | POA: Diagnosis not present

## 2019-05-10 DIAGNOSIS — G4730 Sleep apnea, unspecified: Secondary | ICD-10-CM | POA: Insufficient documentation

## 2019-05-10 DIAGNOSIS — N186 End stage renal disease: Secondary | ICD-10-CM | POA: Diagnosis not present

## 2019-05-10 DIAGNOSIS — E1022 Type 1 diabetes mellitus with diabetic chronic kidney disease: Secondary | ICD-10-CM | POA: Diagnosis not present

## 2019-05-10 HISTORY — DX: Gastro-esophageal reflux disease without esophagitis: K21.9

## 2019-05-10 HISTORY — DX: Neuromuscular dysfunction of bladder, unspecified: N31.9

## 2019-05-10 HISTORY — PX: GRAFT APPLICATION: SHX6696

## 2019-05-10 LAB — POCT I-STAT, CHEM 8
BUN: 42 mg/dL — ABNORMAL HIGH (ref 6–20)
Calcium, Ion: 1.28 mmol/L (ref 1.15–1.40)
Chloride: 101 mmol/L (ref 98–111)
Creatinine, Ser: 2.4 mg/dL — ABNORMAL HIGH (ref 0.61–1.24)
Glucose, Bld: 175 mg/dL — ABNORMAL HIGH (ref 70–99)
HCT: 40 % (ref 39.0–52.0)
Hemoglobin: 13.6 g/dL (ref 13.0–17.0)
Potassium: 4.4 mmol/L (ref 3.5–5.1)
Sodium: 139 mmol/L (ref 135–145)
TCO2: 29 mmol/L (ref 22–32)

## 2019-05-10 SURGERY — GRAFT APPLICATION
Anesthesia: Monitor Anesthesia Care | Site: Foot | Laterality: Left

## 2019-05-10 MED ORDER — PROPOFOL 500 MG/50ML IV EMUL
INTRAVENOUS | Status: AC
  Filled 2019-05-10: qty 50

## 2019-05-10 MED ORDER — CHLORHEXIDINE GLUCONATE CLOTH 2 % EX PADS
6.0000 | MEDICATED_PAD | Freq: Once | CUTANEOUS | Status: DC
  Filled 2019-05-10: qty 6

## 2019-05-10 MED ORDER — MIDAZOLAM HCL 2 MG/2ML IJ SOLN
INTRAMUSCULAR | Status: AC
  Filled 2019-05-10: qty 2

## 2019-05-10 MED ORDER — MEPERIDINE HCL 25 MG/ML IJ SOLN
6.2500 mg | INTRAMUSCULAR | Status: DC | PRN
  Filled 2019-05-10: qty 1

## 2019-05-10 MED ORDER — PROPOFOL 10 MG/ML IV BOLUS
INTRAVENOUS | Status: DC | PRN
  Administered 2019-05-10: 40 mg via INTRAVENOUS

## 2019-05-10 MED ORDER — SODIUM CHLORIDE 0.9% FLUSH
3.0000 mL | INTRAVENOUS | Status: DC | PRN
  Filled 2019-05-10: qty 3

## 2019-05-10 MED ORDER — CEFAZOLIN SODIUM-DEXTROSE 2-4 GM/100ML-% IV SOLN
INTRAVENOUS | Status: AC
  Filled 2019-05-10: qty 100

## 2019-05-10 MED ORDER — PROPOFOL 500 MG/50ML IV EMUL
INTRAVENOUS | Status: DC | PRN
  Administered 2019-05-10: 50 ug/kg/min via INTRAVENOUS

## 2019-05-10 MED ORDER — CEFAZOLIN SODIUM-DEXTROSE 2-4 GM/100ML-% IV SOLN
2.0000 g | INTRAVENOUS | Status: AC
  Administered 2019-05-10: 15:00:00 2 g via INTRAVENOUS
  Filled 2019-05-10: qty 100

## 2019-05-10 MED ORDER — SODIUM CHLORIDE 0.9 % IV SOLN
INTRAVENOUS | Status: DC
  Administered 2019-05-10: 14:00:00 50 mL/h via INTRAVENOUS
  Filled 2019-05-10: qty 1000

## 2019-05-10 MED ORDER — MIDAZOLAM HCL 2 MG/2ML IJ SOLN
INTRAMUSCULAR | Status: DC | PRN
  Administered 2019-05-10: 1.5 mg via INTRAVENOUS

## 2019-05-10 MED ORDER — BUPIVACAINE HCL 0.5 % IJ SOLN
INTRAMUSCULAR | Status: DC | PRN
  Administered 2019-05-10: 5 mL

## 2019-05-10 MED ORDER — ACETAMINOPHEN 325 MG PO TABS
650.0000 mg | ORAL_TABLET | ORAL | Status: DC | PRN
  Filled 2019-05-10: qty 2

## 2019-05-10 MED ORDER — FENTANYL CITRATE (PF) 100 MCG/2ML IJ SOLN
25.0000 ug | INTRAMUSCULAR | Status: DC | PRN
  Filled 2019-05-10: qty 1

## 2019-05-10 MED ORDER — PROMETHAZINE HCL 25 MG/ML IJ SOLN
6.2500 mg | INTRAMUSCULAR | Status: DC | PRN
  Filled 2019-05-10: qty 1

## 2019-05-10 SURGICAL SUPPLY — 39 items
BENZOIN TINCTURE PRP APPL 2/3 (GAUZE/BANDAGES/DRESSINGS) ×3 IMPLANT
BLADE SURG 15 STRL LF DISP TIS (BLADE) ×2 IMPLANT
BLADE SURG 15 STRL SS (BLADE) ×6
BNDG COHESIVE 2X5 TAN STRL LF (GAUZE/BANDAGES/DRESSINGS) ×3 IMPLANT
BNDG COHESIVE 4X5 TAN NS LF (GAUZE/BANDAGES/DRESSINGS) ×3 IMPLANT
BNDG GAUZE ELAST 4 BULKY (GAUZE/BANDAGES/DRESSINGS) ×3 IMPLANT
CHLORAPREP W/TINT 26 (MISCELLANEOUS) ×3 IMPLANT
CLOSURE WOUND 1/2 X4 (GAUZE/BANDAGES/DRESSINGS) ×1
CLOSURE WOUND 1/4X4 (GAUZE/BANDAGES/DRESSINGS) ×4
COVER BACK TABLE 60X90IN (DRAPES) ×3 IMPLANT
COVER MAYO STAND STRL (DRAPES) ×3 IMPLANT
COVER WAND RF STERILE (DRAPES) ×3 IMPLANT
DRAPE EXTREMITY T 121X128X90 (DISPOSABLE) ×3 IMPLANT
DRSG EMULSION OIL 3X3 NADH (GAUZE/BANDAGES/DRESSINGS) ×3 IMPLANT
GAUZE SPONGE 4X4 12PLY STRL (GAUZE/BANDAGES/DRESSINGS) ×3 IMPLANT
GLOVE BIO SURGEON STRL SZ7.5 (GLOVE) ×3 IMPLANT
GLOVE BIOGEL PI IND STRL 6.5 (GLOVE) ×2 IMPLANT
GLOVE BIOGEL PI IND STRL 7.5 (GLOVE) ×2 IMPLANT
GLOVE BIOGEL PI INDICATOR 6.5 (GLOVE) ×4
GLOVE BIOGEL PI INDICATOR 7.5 (GLOVE) ×4
GOWN STRL REUS W/ TWL XL LVL3 (GOWN DISPOSABLE) ×2 IMPLANT
GOWN STRL REUS W/TWL XL LVL3 (GOWN DISPOSABLE) ×6
IMPL STRAVIX 3X6 (Tissue) ×1 IMPLANT
IMPLANT STRAVIX 3X6 (Tissue) ×3 IMPLANT
KIT TURNOVER CYSTO (KITS) ×3 IMPLANT
NEEDLE HYPO 25X1 1.5 SAFETY (NEEDLE) ×3 IMPLANT
PACK BASIN DAY SURGERY FS (CUSTOM PROCEDURE TRAY) ×3 IMPLANT
STOCKINETTE 4X48 STRL (DRAPES) IMPLANT
STOCKINETTE 6  STRL (DRAPES)
STOCKINETTE 6 STRL (DRAPES) IMPLANT
STRIP CLOSURE SKIN 1/2X4 (GAUZE/BANDAGES/DRESSINGS) ×2 IMPLANT
STRIP CLOSURE SKIN 1/4X4 (GAUZE/BANDAGES/DRESSINGS) ×8 IMPLANT
SUT PROLENE 4 0 PS 2 18 (SUTURE) ×9 IMPLANT
SYR BULB 3OZ (MISCELLANEOUS) ×3 IMPLANT
SYR CONTROL 10ML LL (SYRINGE) ×3 IMPLANT
TRAY DSU PREP LF (CUSTOM PROCEDURE TRAY) IMPLANT
TUBE CONNECTING 12'X1/4 (SUCTIONS)
TUBE CONNECTING 12X1/4 (SUCTIONS) IMPLANT
UNDERPAD 30X30 (UNDERPADS AND DIAPERS) ×3 IMPLANT

## 2019-05-10 NOTE — Progress Notes (Addendum)
Blood glucose 157 per dexcom g-6 continuous glucose monitor.  Patient states he can tell if it starts to trend down, and doesn't feel symptomatic now.

## 2019-05-10 NOTE — Anesthesia Preprocedure Evaluation (Addendum)
Anesthesia Evaluation  Patient identified by MRN, date of birth, ID band Patient awake    Reviewed: Allergy & Precautions, H&P , NPO status , Patient's Chart, lab work & pertinent test results  History of Anesthesia Complications (+) PONV and history of anesthetic complications  Airway Mallampati: II  TM Distance: >3 FB Neck ROM: full    Dental  (+) Poor Dentition, Missing, Chipped, Dental Advisory Given   Pulmonary sleep apnea ,    breath sounds clear to auscultation       Cardiovascular + Peripheral Vascular Disease   Rhythm:regular Rate:Normal     Neuro/Psych    GI/Hepatic   Endo/Other  diabetes, Type 1, Insulin Dependent  Renal/GU ESRFRenal disease     Musculoskeletal   Abdominal   Peds  Hematology  (+) Blood dyscrasia, anemia ,   Anesthesia Other Findings   Reproductive/Obstetrics                          Anesthesia Physical  Anesthesia Plan  ASA: III  Anesthesia Plan: MAC   Post-op Pain Management:    Induction: Intravenous  PONV Risk Score and Plan: 4 or greater and Ondansetron, Treatment may vary due to age or medical condition, Midazolam, Propofol infusion and TIVA  Airway Management Planned: LMA  Additional Equipment: None  Intra-op Plan:   Post-operative Plan:   Informed Consent: I have reviewed the patients History and Physical, chart, labs and discussed the procedure including the risks, benefits and alternatives for the proposed anesthesia with the patient or authorized representative who has indicated his/her understanding and acceptance.     Dental advisory given  Plan Discussed with: CRNA  Anesthesia Plan Comments: (See PAT note 08/30/2018, Konrad Felix, PA-C)      Anesthesia Quick Evaluation

## 2019-05-10 NOTE — Progress Notes (Signed)
Patient and his spouse also Instructed on home care per Dr. Fritzi Mandes.

## 2019-05-10 NOTE — Transfer of Care (Signed)
Immediate Anesthesia Transfer of Care Note  Patient: Tony Fisher  Procedure(s) Performed: Procedure(s) (LRB): SKIN GRAFT APPLICATION TO LEFT FOOT (Left)  Patient Location: PACU  Anesthesia Type: MAC  Level of Consciousness: awake, alert , oriented and patient cooperative  Airway & Oxygen Therapy: Patient Spontanous Breathing and Patient connected to face mask oxygen  Post-op Assessment: Report given to PACU RN and Post -op Vital signs reviewed and stable  Post vital signs: Reviewed and stable  Complications: No apparent anesthesia complications Last Vitals:  Vitals Value Taken Time  BP    Temp    Pulse 87 05/10/19 1619  Resp    SpO2 100 % 05/10/19 1619  Vitals shown include unvalidated device data.  Last Pain:  Vitals:   05/10/19 1344  TempSrc: Oral  PainSc: 0-No pain      Patients Stated Pain Goal: 6 (05/10/19 1344)

## 2019-05-10 NOTE — Anesthesia Postprocedure Evaluation (Signed)
Anesthesia Post Note  Patient: Tony Fisher  Procedure(s) Performed: SKIN GRAFT APPLICATION TO LEFT FOOT (Left Foot)     Patient location during evaluation: PACU Anesthesia Type: MAC Level of consciousness: awake and alert Pain management: pain level controlled Vital Signs Assessment: post-procedure vital signs reviewed and stable Respiratory status: spontaneous breathing and respiratory function stable Cardiovascular status: stable Postop Assessment: no apparent nausea or vomiting Anesthetic complications: no    Last Vitals:  Vitals:   05/10/19 1650 05/10/19 1715  BP: (!) 156/76 (!) 154/73  Pulse: 80 83  Resp: (!) 8 12  Temp:  36.6 C  SpO2: 100% 100%    Last Pain:  Vitals:   05/10/19 1715  TempSrc: Oral  PainSc: 0-No pain                 Analiya Porco DANIEL

## 2019-05-10 NOTE — Progress Notes (Signed)
Patient presents today for preparation of wound bed and application of stravix today for chronic left heel ulceration.  H&P reviewed.  All questions answered and no guarantees given.

## 2019-05-10 NOTE — Discharge Instructions (Signed)
  Call your surgeon if you experience:   1.  Fever over 101.0. 2.  Inability to urinate. 3.  Nausea and/or vomiting. 4.  Extreme swelling or bruising at the surgical site. 5.  Continued bleeding from the incision. 6.  Increased pain, redness or drainage from the incision. 7.  Problems related to your pain medication. 8.  Any problems and/or concerns  Non weightbearing on left heel. Use boot to float heel, and keep elevated.   Post Anesthesia Home Care Instructions  Activity: Get plenty of rest for the remainder of the day. A responsible individual must stay with you for 24 hours following the procedure.  For the next 24 hours, DO NOT: -Drive a car -Paediatric nurse -Drink alcoholic beverages -Take any medication unless instructed by your physician -Make any legal decisions or sign important papers.  Meals: Start with liquid foods such as gelatin or soup. Progress to regular foods as tolerated. Avoid greasy, spicy, heavy foods. If nausea and/or vomiting occur, drink only clear liquids until the nausea and/or vomiting subsides. Call your physician if vomiting continues.  Special Instructions/Symptoms: Your throat may feel dry or sore from the anesthesia or the breathing tube placed in your throat during surgery. If this causes discomfort, gargle with warm salt water. The discomfort should disappear within 24 hours.

## 2019-05-12 NOTE — Op Note (Signed)
NAME: Tony Fisher, Tony Fisher MEDICAL RECORD FT:[id number] ACCOUNT [id number] DATE OF BIRTH:1970-11-15 FACILITY: [facility] LOCATION: [facility] PHYSICIAN:Luqman Perrelli, DPM  OPERATIVE REPORT  DATE OF PROCEDURE:  05/10/2019  SURGEON:  Rosemary Holms, DPM  ASSISTANT:  None.  PREOPERATIVE DIAGNOSIS:  Chronic ulceration, left heel.  POSTOPERATIVE DIAGNOSIS:  Chronic ulceration, left heel.    PROCEDURE:  Preparation of left wound bed and application of Stravix graft.  ANESTHESIA:  Sedation.    INDICATIONS:  The patient was brought to the OR and placed in the prone position at which time monitored anesthesia care was administered.  The patient was prepped and draped in the usual aseptic manner.  Attention was directed to the left heel where  photographs were taken.  Measurements were also taken and this measured 3.4 cm in length and 2.4 cm in width.  The patient has a history of chronic wound secondary to diabetes and has failed to respond to good standard wound care for one and a half years, which has included debridement to the wound bed, advanced dressings, collagen, Santyl, Grafix.  Therefore,  use of advanced therapies is indicated at this time.  Underlying disease is being treated by several physicians.  The patient has been offloaded for the diabetic foot ulcer.  No signs of infection or abscesses were noted, but there is absence of  osteomyelitis.  The patient is adequately nourished for healing.  DESCRIPTION OF PROCEDURE:  The patient presents for sterile application of a 3 cm x 6 cm piece of Stravix.  The foot was prepped and attention was directed to the wound site.  The wound site was surgically debrided the full thickness of the wound bed  excised with a 15 blade to prepare the wound site for the graft tissue.  All nonviable tissue was removed and debrided from the procedural field and the wound site.  Care was taken to assure the best wound bed for application of the graft.  No signs  of  infection or abscess noted.  The wound was flushed with copious amounts of sterile saline.  The graft was removed from its packaging and placed in warm saline.  It was carefully applied to the wound base using forceps.  At this point, great care was  taken to make sure the graft was in full contact with the wound base and 4-0 Prolene was used to suture the graft around the perimeter.  No seroma or hematoma was noted with the application of the graft, which was also fenestrated.  Prolene was used to  anchor the graft and secure in position, and complete contact was achieved.  Then, Adaptic was applied and placed over the graft to maintain moisture and prevent infection, as well as Steri-Strips, fluffs, 4 x 4, Kling and Coban.  The patient was sent to  the recovery room with vital signs stable and capillary refill time at presurgical levels.  The patient's instructions included to be nonweightbearing, keep dressing dry and intact.  He will follow up in six days.  JN/NUANCE  D:05/12/2019 T:05/12/2019 JOB:[id number]

## 2019-05-16 DIAGNOSIS — L97522 Non-pressure chronic ulcer of other part of left foot with fat layer exposed: Secondary | ICD-10-CM | POA: Diagnosis not present

## 2019-05-16 DIAGNOSIS — Z89519 Acquired absence of unspecified leg below knee: Secondary | ICD-10-CM | POA: Diagnosis not present

## 2019-05-16 DIAGNOSIS — E1040 Type 1 diabetes mellitus with diabetic neuropathy, unspecified: Secondary | ICD-10-CM | POA: Diagnosis not present

## 2019-05-20 NOTE — H&P (Signed)
No changes in medical history H&P performed by Dr. Edrick Oh reviewed

## 2019-05-23 DIAGNOSIS — E1129 Type 2 diabetes mellitus with other diabetic kidney complication: Secondary | ICD-10-CM | POA: Diagnosis not present

## 2019-05-23 DIAGNOSIS — E1040 Type 1 diabetes mellitus with diabetic neuropathy, unspecified: Secondary | ICD-10-CM | POA: Diagnosis not present

## 2019-05-23 DIAGNOSIS — Z89519 Acquired absence of unspecified leg below knee: Secondary | ICD-10-CM | POA: Diagnosis not present

## 2019-05-23 DIAGNOSIS — L97522 Non-pressure chronic ulcer of other part of left foot with fat layer exposed: Secondary | ICD-10-CM | POA: Diagnosis not present

## 2019-05-30 DIAGNOSIS — L97522 Non-pressure chronic ulcer of other part of left foot with fat layer exposed: Secondary | ICD-10-CM | POA: Diagnosis not present

## 2019-05-30 DIAGNOSIS — E1040 Type 1 diabetes mellitus with diabetic neuropathy, unspecified: Secondary | ICD-10-CM | POA: Diagnosis not present

## 2019-05-30 DIAGNOSIS — Z89519 Acquired absence of unspecified leg below knee: Secondary | ICD-10-CM | POA: Diagnosis not present

## 2019-06-06 DIAGNOSIS — L97522 Non-pressure chronic ulcer of other part of left foot with fat layer exposed: Secondary | ICD-10-CM | POA: Diagnosis not present

## 2019-06-06 DIAGNOSIS — Z89519 Acquired absence of unspecified leg below knee: Secondary | ICD-10-CM | POA: Diagnosis not present

## 2019-06-06 DIAGNOSIS — E1040 Type 1 diabetes mellitus with diabetic neuropathy, unspecified: Secondary | ICD-10-CM | POA: Diagnosis not present

## 2019-06-13 DIAGNOSIS — L97522 Non-pressure chronic ulcer of other part of left foot with fat layer exposed: Secondary | ICD-10-CM | POA: Diagnosis not present

## 2019-06-13 DIAGNOSIS — Z89519 Acquired absence of unspecified leg below knee: Secondary | ICD-10-CM | POA: Diagnosis not present

## 2019-06-13 DIAGNOSIS — E1040 Type 1 diabetes mellitus with diabetic neuropathy, unspecified: Secondary | ICD-10-CM | POA: Diagnosis not present

## 2019-06-27 DIAGNOSIS — E1040 Type 1 diabetes mellitus with diabetic neuropathy, unspecified: Secondary | ICD-10-CM | POA: Diagnosis not present

## 2019-06-27 DIAGNOSIS — Z89519 Acquired absence of unspecified leg below knee: Secondary | ICD-10-CM | POA: Diagnosis not present

## 2019-06-27 DIAGNOSIS — L97522 Non-pressure chronic ulcer of other part of left foot with fat layer exposed: Secondary | ICD-10-CM | POA: Diagnosis not present

## 2019-07-11 DIAGNOSIS — B351 Tinea unguium: Secondary | ICD-10-CM | POA: Diagnosis not present

## 2019-07-11 DIAGNOSIS — E1040 Type 1 diabetes mellitus with diabetic neuropathy, unspecified: Secondary | ICD-10-CM | POA: Diagnosis not present

## 2019-07-11 DIAGNOSIS — L97522 Non-pressure chronic ulcer of other part of left foot with fat layer exposed: Secondary | ICD-10-CM | POA: Diagnosis not present

## 2019-07-11 DIAGNOSIS — Z89519 Acquired absence of unspecified leg below knee: Secondary | ICD-10-CM | POA: Diagnosis not present

## 2019-07-16 DIAGNOSIS — E1021 Type 1 diabetes mellitus with diabetic nephropathy: Secondary | ICD-10-CM | POA: Diagnosis not present

## 2019-08-22 DIAGNOSIS — L97522 Non-pressure chronic ulcer of other part of left foot with fat layer exposed: Secondary | ICD-10-CM | POA: Diagnosis not present

## 2019-08-27 DIAGNOSIS — N2581 Secondary hyperparathyroidism of renal origin: Secondary | ICD-10-CM | POA: Diagnosis not present

## 2019-08-27 DIAGNOSIS — L97519 Non-pressure chronic ulcer of other part of right foot with unspecified severity: Secondary | ICD-10-CM | POA: Diagnosis not present

## 2019-08-27 DIAGNOSIS — I129 Hypertensive chronic kidney disease with stage 1 through stage 4 chronic kidney disease, or unspecified chronic kidney disease: Secondary | ICD-10-CM | POA: Diagnosis not present

## 2019-08-27 DIAGNOSIS — Z9225 Personal history of immunosupression therapy: Secondary | ICD-10-CM | POA: Diagnosis not present

## 2019-08-27 DIAGNOSIS — I739 Peripheral vascular disease, unspecified: Secondary | ICD-10-CM | POA: Diagnosis not present

## 2019-08-27 DIAGNOSIS — E11319 Type 2 diabetes mellitus with unspecified diabetic retinopathy without macular edema: Secondary | ICD-10-CM | POA: Diagnosis not present

## 2019-08-27 DIAGNOSIS — Z9483 Pancreas transplant status: Secondary | ICD-10-CM | POA: Diagnosis not present

## 2019-08-27 DIAGNOSIS — E10621 Type 1 diabetes mellitus with foot ulcer: Secondary | ICD-10-CM | POA: Diagnosis not present

## 2019-08-27 DIAGNOSIS — E1122 Type 2 diabetes mellitus with diabetic chronic kidney disease: Secondary | ICD-10-CM | POA: Diagnosis not present

## 2019-08-27 DIAGNOSIS — N189 Chronic kidney disease, unspecified: Secondary | ICD-10-CM | POA: Diagnosis not present

## 2019-08-27 DIAGNOSIS — D631 Anemia in chronic kidney disease: Secondary | ICD-10-CM | POA: Diagnosis not present

## 2019-08-27 DIAGNOSIS — Z940 Kidney transplant status: Secondary | ICD-10-CM | POA: Diagnosis not present

## 2019-09-05 DIAGNOSIS — E1040 Type 1 diabetes mellitus with diabetic neuropathy, unspecified: Secondary | ICD-10-CM | POA: Diagnosis not present

## 2019-09-05 DIAGNOSIS — L97522 Non-pressure chronic ulcer of other part of left foot with fat layer exposed: Secondary | ICD-10-CM | POA: Diagnosis not present

## 2019-09-12 DIAGNOSIS — Z89519 Acquired absence of unspecified leg below knee: Secondary | ICD-10-CM | POA: Diagnosis not present

## 2019-09-12 DIAGNOSIS — L97522 Non-pressure chronic ulcer of other part of left foot with fat layer exposed: Secondary | ICD-10-CM | POA: Diagnosis not present

## 2019-09-12 DIAGNOSIS — E1040 Type 1 diabetes mellitus with diabetic neuropathy, unspecified: Secondary | ICD-10-CM | POA: Diagnosis not present

## 2019-09-19 DIAGNOSIS — L97522 Non-pressure chronic ulcer of other part of left foot with fat layer exposed: Secondary | ICD-10-CM | POA: Diagnosis not present

## 2019-09-19 DIAGNOSIS — E1040 Type 1 diabetes mellitus with diabetic neuropathy, unspecified: Secondary | ICD-10-CM | POA: Diagnosis not present

## 2019-10-09 DIAGNOSIS — Z89431 Acquired absence of right foot: Secondary | ICD-10-CM | POA: Diagnosis not present

## 2019-10-09 DIAGNOSIS — B351 Tinea unguium: Secondary | ICD-10-CM | POA: Diagnosis not present

## 2019-10-09 DIAGNOSIS — L97522 Non-pressure chronic ulcer of other part of left foot with fat layer exposed: Secondary | ICD-10-CM | POA: Diagnosis not present

## 2019-10-09 DIAGNOSIS — E1040 Type 1 diabetes mellitus with diabetic neuropathy, unspecified: Secondary | ICD-10-CM | POA: Diagnosis not present

## 2019-10-09 DIAGNOSIS — Z89519 Acquired absence of unspecified leg below knee: Secondary | ICD-10-CM | POA: Diagnosis not present

## 2019-10-22 DIAGNOSIS — Z9641 Presence of insulin pump (external) (internal): Secondary | ICD-10-CM | POA: Diagnosis not present

## 2019-10-22 DIAGNOSIS — E1021 Type 1 diabetes mellitus with diabetic nephropathy: Secondary | ICD-10-CM | POA: Diagnosis not present

## 2019-10-22 DIAGNOSIS — E10319 Type 1 diabetes mellitus with unspecified diabetic retinopathy without macular edema: Secondary | ICD-10-CM | POA: Diagnosis not present

## 2019-10-22 DIAGNOSIS — Z794 Long term (current) use of insulin: Secondary | ICD-10-CM | POA: Diagnosis not present

## 2019-10-22 DIAGNOSIS — N186 End stage renal disease: Secondary | ICD-10-CM | POA: Diagnosis not present

## 2019-10-22 DIAGNOSIS — E1022 Type 1 diabetes mellitus with diabetic chronic kidney disease: Secondary | ICD-10-CM | POA: Diagnosis not present

## 2019-10-22 DIAGNOSIS — E1042 Type 1 diabetes mellitus with diabetic polyneuropathy: Secondary | ICD-10-CM | POA: Diagnosis not present

## 2019-10-22 DIAGNOSIS — Z940 Kidney transplant status: Secondary | ICD-10-CM | POA: Diagnosis not present

## 2019-10-22 DIAGNOSIS — E10621 Type 1 diabetes mellitus with foot ulcer: Secondary | ICD-10-CM | POA: Diagnosis not present

## 2019-10-22 DIAGNOSIS — Z89511 Acquired absence of right leg below knee: Secondary | ICD-10-CM | POA: Diagnosis not present

## 2019-10-24 DIAGNOSIS — L97522 Non-pressure chronic ulcer of other part of left foot with fat layer exposed: Secondary | ICD-10-CM | POA: Diagnosis not present

## 2019-10-24 DIAGNOSIS — E1040 Type 1 diabetes mellitus with diabetic neuropathy, unspecified: Secondary | ICD-10-CM | POA: Diagnosis not present

## 2019-11-07 DIAGNOSIS — L97522 Non-pressure chronic ulcer of other part of left foot with fat layer exposed: Secondary | ICD-10-CM | POA: Diagnosis not present

## 2019-11-07 DIAGNOSIS — Z89431 Acquired absence of right foot: Secondary | ICD-10-CM | POA: Diagnosis not present

## 2019-11-07 DIAGNOSIS — E1040 Type 1 diabetes mellitus with diabetic neuropathy, unspecified: Secondary | ICD-10-CM | POA: Diagnosis not present

## 2019-11-07 DIAGNOSIS — Z89519 Acquired absence of unspecified leg below knee: Secondary | ICD-10-CM | POA: Diagnosis not present

## 2019-11-21 DIAGNOSIS — L97522 Non-pressure chronic ulcer of other part of left foot with fat layer exposed: Secondary | ICD-10-CM | POA: Diagnosis not present

## 2019-12-05 DIAGNOSIS — L97522 Non-pressure chronic ulcer of other part of left foot with fat layer exposed: Secondary | ICD-10-CM | POA: Diagnosis not present

## 2019-12-23 DIAGNOSIS — E10621 Type 1 diabetes mellitus with foot ulcer: Secondary | ICD-10-CM | POA: Diagnosis not present

## 2019-12-23 DIAGNOSIS — Z9483 Pancreas transplant status: Secondary | ICD-10-CM | POA: Diagnosis not present

## 2019-12-23 DIAGNOSIS — Z9225 Personal history of immunosupression therapy: Secondary | ICD-10-CM | POA: Diagnosis not present

## 2019-12-23 DIAGNOSIS — L97522 Non-pressure chronic ulcer of other part of left foot with fat layer exposed: Secondary | ICD-10-CM | POA: Diagnosis not present

## 2019-12-23 DIAGNOSIS — D631 Anemia in chronic kidney disease: Secondary | ICD-10-CM | POA: Diagnosis not present

## 2019-12-23 DIAGNOSIS — I129 Hypertensive chronic kidney disease with stage 1 through stage 4 chronic kidney disease, or unspecified chronic kidney disease: Secondary | ICD-10-CM | POA: Diagnosis not present

## 2019-12-23 DIAGNOSIS — Z940 Kidney transplant status: Secondary | ICD-10-CM | POA: Diagnosis not present

## 2019-12-23 DIAGNOSIS — L97519 Non-pressure chronic ulcer of other part of right foot with unspecified severity: Secondary | ICD-10-CM | POA: Diagnosis not present

## 2019-12-23 DIAGNOSIS — E1122 Type 2 diabetes mellitus with diabetic chronic kidney disease: Secondary | ICD-10-CM | POA: Diagnosis not present

## 2019-12-23 DIAGNOSIS — E1040 Type 1 diabetes mellitus with diabetic neuropathy, unspecified: Secondary | ICD-10-CM | POA: Diagnosis not present

## 2019-12-23 DIAGNOSIS — N189 Chronic kidney disease, unspecified: Secondary | ICD-10-CM | POA: Diagnosis not present

## 2019-12-23 DIAGNOSIS — E11319 Type 2 diabetes mellitus with unspecified diabetic retinopathy without macular edema: Secondary | ICD-10-CM | POA: Diagnosis not present

## 2019-12-23 DIAGNOSIS — N2581 Secondary hyperparathyroidism of renal origin: Secondary | ICD-10-CM | POA: Diagnosis not present

## 2019-12-23 DIAGNOSIS — I739 Peripheral vascular disease, unspecified: Secondary | ICD-10-CM | POA: Diagnosis not present

## 2020-01-16 DIAGNOSIS — E1040 Type 1 diabetes mellitus with diabetic neuropathy, unspecified: Secondary | ICD-10-CM | POA: Diagnosis not present

## 2020-01-16 DIAGNOSIS — Z89519 Acquired absence of unspecified leg below knee: Secondary | ICD-10-CM | POA: Diagnosis not present

## 2020-01-16 DIAGNOSIS — Z89431 Acquired absence of right foot: Secondary | ICD-10-CM | POA: Diagnosis not present

## 2020-01-16 DIAGNOSIS — L97522 Non-pressure chronic ulcer of other part of left foot with fat layer exposed: Secondary | ICD-10-CM | POA: Diagnosis not present

## 2020-01-21 DIAGNOSIS — Z89511 Acquired absence of right leg below knee: Secondary | ICD-10-CM | POA: Diagnosis not present

## 2020-01-21 DIAGNOSIS — Z9641 Presence of insulin pump (external) (internal): Secondary | ICD-10-CM | POA: Diagnosis not present

## 2020-01-21 DIAGNOSIS — Z9889 Other specified postprocedural states: Secondary | ICD-10-CM | POA: Diagnosis not present

## 2020-01-21 DIAGNOSIS — E1021 Type 1 diabetes mellitus with diabetic nephropathy: Secondary | ICD-10-CM | POA: Diagnosis not present

## 2020-01-21 DIAGNOSIS — Z940 Kidney transplant status: Secondary | ICD-10-CM | POA: Diagnosis not present

## 2020-01-21 DIAGNOSIS — E10319 Type 1 diabetes mellitus with unspecified diabetic retinopathy without macular edema: Secondary | ICD-10-CM | POA: Diagnosis not present

## 2020-01-21 DIAGNOSIS — E1042 Type 1 diabetes mellitus with diabetic polyneuropathy: Secondary | ICD-10-CM | POA: Diagnosis not present

## 2020-01-21 DIAGNOSIS — Z872 Personal history of diseases of the skin and subcutaneous tissue: Secondary | ICD-10-CM | POA: Diagnosis not present

## 2020-01-21 DIAGNOSIS — Z794 Long term (current) use of insulin: Secondary | ICD-10-CM | POA: Diagnosis not present

## 2020-01-30 DIAGNOSIS — L97522 Non-pressure chronic ulcer of other part of left foot with fat layer exposed: Secondary | ICD-10-CM | POA: Diagnosis not present

## 2020-01-30 DIAGNOSIS — E1040 Type 1 diabetes mellitus with diabetic neuropathy, unspecified: Secondary | ICD-10-CM | POA: Diagnosis not present

## 2020-02-20 DIAGNOSIS — Z89519 Acquired absence of unspecified leg below knee: Secondary | ICD-10-CM | POA: Diagnosis not present

## 2020-02-20 DIAGNOSIS — E1051 Type 1 diabetes mellitus with diabetic peripheral angiopathy without gangrene: Secondary | ICD-10-CM | POA: Diagnosis not present

## 2020-02-20 DIAGNOSIS — B351 Tinea unguium: Secondary | ICD-10-CM | POA: Diagnosis not present

## 2020-02-20 DIAGNOSIS — E1040 Type 1 diabetes mellitus with diabetic neuropathy, unspecified: Secondary | ICD-10-CM | POA: Diagnosis not present

## 2020-02-20 DIAGNOSIS — Z89431 Acquired absence of right foot: Secondary | ICD-10-CM | POA: Diagnosis not present

## 2020-02-20 DIAGNOSIS — L97522 Non-pressure chronic ulcer of other part of left foot with fat layer exposed: Secondary | ICD-10-CM | POA: Diagnosis not present

## 2020-02-24 DIAGNOSIS — H25813 Combined forms of age-related cataract, bilateral: Secondary | ICD-10-CM | POA: Diagnosis not present

## 2020-02-24 DIAGNOSIS — E103593 Type 1 diabetes mellitus with proliferative diabetic retinopathy without macular edema, bilateral: Secondary | ICD-10-CM | POA: Diagnosis not present

## 2020-03-10 DIAGNOSIS — H2513 Age-related nuclear cataract, bilateral: Secondary | ICD-10-CM | POA: Diagnosis not present

## 2020-03-10 DIAGNOSIS — E103553 Type 1 diabetes mellitus with stable proliferative diabetic retinopathy, bilateral: Secondary | ICD-10-CM | POA: Diagnosis not present

## 2020-03-10 DIAGNOSIS — E103593 Type 1 diabetes mellitus with proliferative diabetic retinopathy without macular edema, bilateral: Secondary | ICD-10-CM | POA: Diagnosis not present

## 2020-03-19 DIAGNOSIS — E1040 Type 1 diabetes mellitus with diabetic neuropathy, unspecified: Secondary | ICD-10-CM | POA: Diagnosis not present

## 2020-03-19 DIAGNOSIS — L97522 Non-pressure chronic ulcer of other part of left foot with fat layer exposed: Secondary | ICD-10-CM | POA: Diagnosis not present

## 2020-03-23 DIAGNOSIS — E1040 Type 1 diabetes mellitus with diabetic neuropathy, unspecified: Secondary | ICD-10-CM | POA: Diagnosis not present

## 2020-03-23 DIAGNOSIS — E1051 Type 1 diabetes mellitus with diabetic peripheral angiopathy without gangrene: Secondary | ICD-10-CM | POA: Diagnosis not present

## 2020-03-23 DIAGNOSIS — L97522 Non-pressure chronic ulcer of other part of left foot with fat layer exposed: Secondary | ICD-10-CM | POA: Diagnosis not present

## 2020-03-23 DIAGNOSIS — L89629 Pressure ulcer of left heel, unspecified stage: Secondary | ICD-10-CM | POA: Diagnosis not present

## 2020-03-26 DIAGNOSIS — E1040 Type 1 diabetes mellitus with diabetic neuropathy, unspecified: Secondary | ICD-10-CM | POA: Diagnosis not present

## 2020-03-26 DIAGNOSIS — L97522 Non-pressure chronic ulcer of other part of left foot with fat layer exposed: Secondary | ICD-10-CM | POA: Diagnosis not present

## 2020-03-31 DIAGNOSIS — L97529 Non-pressure chronic ulcer of other part of left foot with unspecified severity: Secondary | ICD-10-CM | POA: Diagnosis not present

## 2020-03-31 DIAGNOSIS — D84821 Immunodeficiency due to drugs: Secondary | ICD-10-CM | POA: Diagnosis not present

## 2020-03-31 DIAGNOSIS — I120 Hypertensive chronic kidney disease with stage 5 chronic kidney disease or end stage renal disease: Secondary | ICD-10-CM | POA: Diagnosis not present

## 2020-03-31 DIAGNOSIS — E1051 Type 1 diabetes mellitus with diabetic peripheral angiopathy without gangrene: Secondary | ICD-10-CM | POA: Diagnosis not present

## 2020-03-31 DIAGNOSIS — E1022 Type 1 diabetes mellitus with diabetic chronic kidney disease: Secondary | ICD-10-CM | POA: Diagnosis not present

## 2020-03-31 DIAGNOSIS — Z89511 Acquired absence of right leg below knee: Secondary | ICD-10-CM | POA: Diagnosis not present

## 2020-03-31 DIAGNOSIS — N319 Neuromuscular dysfunction of bladder, unspecified: Secondary | ICD-10-CM | POA: Diagnosis not present

## 2020-03-31 DIAGNOSIS — E10621 Type 1 diabetes mellitus with foot ulcer: Secondary | ICD-10-CM | POA: Diagnosis not present

## 2020-03-31 DIAGNOSIS — N186 End stage renal disease: Secondary | ICD-10-CM | POA: Diagnosis not present

## 2020-03-31 DIAGNOSIS — Z9641 Presence of insulin pump (external) (internal): Secondary | ICD-10-CM | POA: Diagnosis not present

## 2020-03-31 DIAGNOSIS — Z79899 Other long term (current) drug therapy: Secondary | ICD-10-CM | POA: Diagnosis not present

## 2020-03-31 DIAGNOSIS — E1065 Type 1 diabetes mellitus with hyperglycemia: Secondary | ICD-10-CM | POA: Diagnosis not present

## 2020-03-31 DIAGNOSIS — L97509 Non-pressure chronic ulcer of other part of unspecified foot with unspecified severity: Secondary | ICD-10-CM | POA: Diagnosis not present

## 2020-03-31 DIAGNOSIS — K56609 Unspecified intestinal obstruction, unspecified as to partial versus complete obstruction: Secondary | ICD-10-CM | POA: Diagnosis not present

## 2020-03-31 DIAGNOSIS — Z5181 Encounter for therapeutic drug level monitoring: Secondary | ICD-10-CM | POA: Diagnosis not present

## 2020-03-31 DIAGNOSIS — Z940 Kidney transplant status: Secondary | ICD-10-CM | POA: Diagnosis not present

## 2020-03-31 DIAGNOSIS — D631 Anemia in chronic kidney disease: Secondary | ICD-10-CM | POA: Diagnosis not present

## 2020-03-31 DIAGNOSIS — R7989 Other specified abnormal findings of blood chemistry: Secondary | ICD-10-CM | POA: Diagnosis not present

## 2020-03-31 DIAGNOSIS — Z4822 Encounter for aftercare following kidney transplant: Secondary | ICD-10-CM | POA: Diagnosis not present

## 2020-03-31 DIAGNOSIS — D849 Immunodeficiency, unspecified: Secondary | ICD-10-CM | POA: Diagnosis not present

## 2020-03-31 DIAGNOSIS — T86891 Other transplanted tissue failure: Secondary | ICD-10-CM | POA: Diagnosis not present

## 2020-03-31 DIAGNOSIS — Z794 Long term (current) use of insulin: Secondary | ICD-10-CM | POA: Diagnosis not present

## 2020-03-31 DIAGNOSIS — I10 Essential (primary) hypertension: Secondary | ICD-10-CM | POA: Diagnosis not present

## 2020-04-02 DIAGNOSIS — E1040 Type 1 diabetes mellitus with diabetic neuropathy, unspecified: Secondary | ICD-10-CM | POA: Diagnosis not present

## 2020-04-02 DIAGNOSIS — L97522 Non-pressure chronic ulcer of other part of left foot with fat layer exposed: Secondary | ICD-10-CM | POA: Diagnosis not present

## 2020-04-02 DIAGNOSIS — Z89431 Acquired absence of right foot: Secondary | ICD-10-CM | POA: Diagnosis not present

## 2020-04-02 DIAGNOSIS — Z89519 Acquired absence of unspecified leg below knee: Secondary | ICD-10-CM | POA: Diagnosis not present

## 2020-04-07 DIAGNOSIS — Z940 Kidney transplant status: Secondary | ICD-10-CM | POA: Diagnosis not present

## 2020-04-07 DIAGNOSIS — Z9225 Personal history of immunosupression therapy: Secondary | ICD-10-CM | POA: Diagnosis not present

## 2020-04-07 DIAGNOSIS — E11319 Type 2 diabetes mellitus with unspecified diabetic retinopathy without macular edema: Secondary | ICD-10-CM | POA: Diagnosis not present

## 2020-04-07 DIAGNOSIS — D631 Anemia in chronic kidney disease: Secondary | ICD-10-CM | POA: Diagnosis not present

## 2020-04-07 DIAGNOSIS — I129 Hypertensive chronic kidney disease with stage 1 through stage 4 chronic kidney disease, or unspecified chronic kidney disease: Secondary | ICD-10-CM | POA: Diagnosis not present

## 2020-04-07 DIAGNOSIS — E1122 Type 2 diabetes mellitus with diabetic chronic kidney disease: Secondary | ICD-10-CM | POA: Diagnosis not present

## 2020-04-07 DIAGNOSIS — N2581 Secondary hyperparathyroidism of renal origin: Secondary | ICD-10-CM | POA: Diagnosis not present

## 2020-04-07 DIAGNOSIS — I739 Peripheral vascular disease, unspecified: Secondary | ICD-10-CM | POA: Diagnosis not present

## 2020-04-07 DIAGNOSIS — N189 Chronic kidney disease, unspecified: Secondary | ICD-10-CM | POA: Diagnosis not present

## 2020-04-07 DIAGNOSIS — E10621 Type 1 diabetes mellitus with foot ulcer: Secondary | ICD-10-CM | POA: Diagnosis not present

## 2020-04-07 DIAGNOSIS — Z9483 Pancreas transplant status: Secondary | ICD-10-CM | POA: Diagnosis not present

## 2020-04-07 DIAGNOSIS — K219 Gastro-esophageal reflux disease without esophagitis: Secondary | ICD-10-CM | POA: Diagnosis not present

## 2020-04-16 DIAGNOSIS — E1040 Type 1 diabetes mellitus with diabetic neuropathy, unspecified: Secondary | ICD-10-CM | POA: Diagnosis not present

## 2020-04-16 DIAGNOSIS — L97522 Non-pressure chronic ulcer of other part of left foot with fat layer exposed: Secondary | ICD-10-CM | POA: Diagnosis not present

## 2020-04-21 DIAGNOSIS — Z9641 Presence of insulin pump (external) (internal): Secondary | ICD-10-CM | POA: Diagnosis not present

## 2020-04-21 DIAGNOSIS — E10319 Type 1 diabetes mellitus with unspecified diabetic retinopathy without macular edema: Secondary | ICD-10-CM | POA: Diagnosis not present

## 2020-04-21 DIAGNOSIS — E1021 Type 1 diabetes mellitus with diabetic nephropathy: Secondary | ICD-10-CM | POA: Diagnosis not present

## 2020-04-21 DIAGNOSIS — Z89511 Acquired absence of right leg below knee: Secondary | ICD-10-CM | POA: Diagnosis not present

## 2020-04-21 DIAGNOSIS — Z79899 Other long term (current) drug therapy: Secondary | ICD-10-CM | POA: Diagnosis not present

## 2020-04-21 DIAGNOSIS — E1042 Type 1 diabetes mellitus with diabetic polyneuropathy: Secondary | ICD-10-CM | POA: Diagnosis not present

## 2020-04-21 DIAGNOSIS — Z940 Kidney transplant status: Secondary | ICD-10-CM | POA: Diagnosis not present

## 2020-04-30 DIAGNOSIS — L97522 Non-pressure chronic ulcer of other part of left foot with fat layer exposed: Secondary | ICD-10-CM | POA: Diagnosis not present

## 2020-04-30 DIAGNOSIS — E1051 Type 1 diabetes mellitus with diabetic peripheral angiopathy without gangrene: Secondary | ICD-10-CM | POA: Diagnosis not present

## 2020-04-30 DIAGNOSIS — E1040 Type 1 diabetes mellitus with diabetic neuropathy, unspecified: Secondary | ICD-10-CM | POA: Diagnosis not present

## 2020-04-30 DIAGNOSIS — Z89431 Acquired absence of right foot: Secondary | ICD-10-CM | POA: Diagnosis not present

## 2020-05-11 ENCOUNTER — Ambulatory Visit (INDEPENDENT_AMBULATORY_CARE_PROVIDER_SITE_OTHER): Payer: Medicare Other | Admitting: Orthopedic Surgery

## 2020-05-11 ENCOUNTER — Ambulatory Visit (INDEPENDENT_AMBULATORY_CARE_PROVIDER_SITE_OTHER): Payer: Medicare Other

## 2020-05-11 DIAGNOSIS — Z89511 Acquired absence of right leg below knee: Secondary | ICD-10-CM

## 2020-05-11 DIAGNOSIS — L97421 Non-pressure chronic ulcer of left heel and midfoot limited to breakdown of skin: Secondary | ICD-10-CM

## 2020-05-11 DIAGNOSIS — M25572 Pain in left ankle and joints of left foot: Secondary | ICD-10-CM | POA: Diagnosis not present

## 2020-05-12 ENCOUNTER — Encounter: Payer: Self-pay | Admitting: Orthopedic Surgery
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
## 2020-05-14 DIAGNOSIS — L97522 Non-pressure chronic ulcer of other part of left foot with fat layer exposed: Secondary | ICD-10-CM | POA: Diagnosis not present

## 2020-05-14 DIAGNOSIS — E1040 Type 1 diabetes mellitus with diabetic neuropathy, unspecified: Secondary | ICD-10-CM | POA: Diagnosis not present

## 2020-05-14 DIAGNOSIS — E1051 Type 1 diabetes mellitus with diabetic peripheral angiopathy without gangrene: Secondary | ICD-10-CM | POA: Diagnosis not present

## 2020-05-15 DIAGNOSIS — E1051 Type 1 diabetes mellitus with diabetic peripheral angiopathy without gangrene: Secondary | ICD-10-CM | POA: Diagnosis not present

## 2020-05-15 DIAGNOSIS — L97522 Non-pressure chronic ulcer of other part of left foot with fat layer exposed: Secondary | ICD-10-CM | POA: Diagnosis not present

## 2020-05-15 DIAGNOSIS — E1040 Type 1 diabetes mellitus with diabetic neuropathy, unspecified: Secondary | ICD-10-CM | POA: Diagnosis not present

## 2020-05-28 DIAGNOSIS — E1051 Type 1 diabetes mellitus with diabetic peripheral angiopathy without gangrene: Secondary | ICD-10-CM | POA: Diagnosis not present

## 2020-05-28 DIAGNOSIS — E1040 Type 1 diabetes mellitus with diabetic neuropathy, unspecified: Secondary | ICD-10-CM | POA: Diagnosis not present

## 2020-05-28 DIAGNOSIS — L97522 Non-pressure chronic ulcer of other part of left foot with fat layer exposed: Secondary | ICD-10-CM | POA: Diagnosis not present

## 2020-06-11 DIAGNOSIS — L97522 Non-pressure chronic ulcer of other part of left foot with fat layer exposed: Secondary | ICD-10-CM | POA: Diagnosis not present

## 2020-06-11 DIAGNOSIS — E1040 Type 1 diabetes mellitus with diabetic neuropathy, unspecified: Secondary | ICD-10-CM | POA: Diagnosis not present

## 2020-06-11 DIAGNOSIS — B351 Tinea unguium: Secondary | ICD-10-CM | POA: Diagnosis not present

## 2020-06-11 DIAGNOSIS — L03116 Cellulitis of left lower limb: Secondary | ICD-10-CM | POA: Diagnosis not present

## 2020-06-11 DIAGNOSIS — E1051 Type 1 diabetes mellitus with diabetic peripheral angiopathy without gangrene: Secondary | ICD-10-CM | POA: Diagnosis not present

## 2020-06-11 DIAGNOSIS — Z89519 Acquired absence of unspecified leg below knee: Secondary | ICD-10-CM | POA: Diagnosis not present

## 2020-07-01 DIAGNOSIS — L97522 Non-pressure chronic ulcer of other part of left foot with fat layer exposed: Secondary | ICD-10-CM | POA: Diagnosis not present

## 2020-07-18 IMAGING — CR DG FINGER THUMB 2+V*R*
3 series · 3 of 3 positions shown · non-contrast
Comparison: None.

CLINICAL DATA: Possible cellulitis, swelling.

EXAM:
RIGHT THUMB 2+V

[x finger pa right]
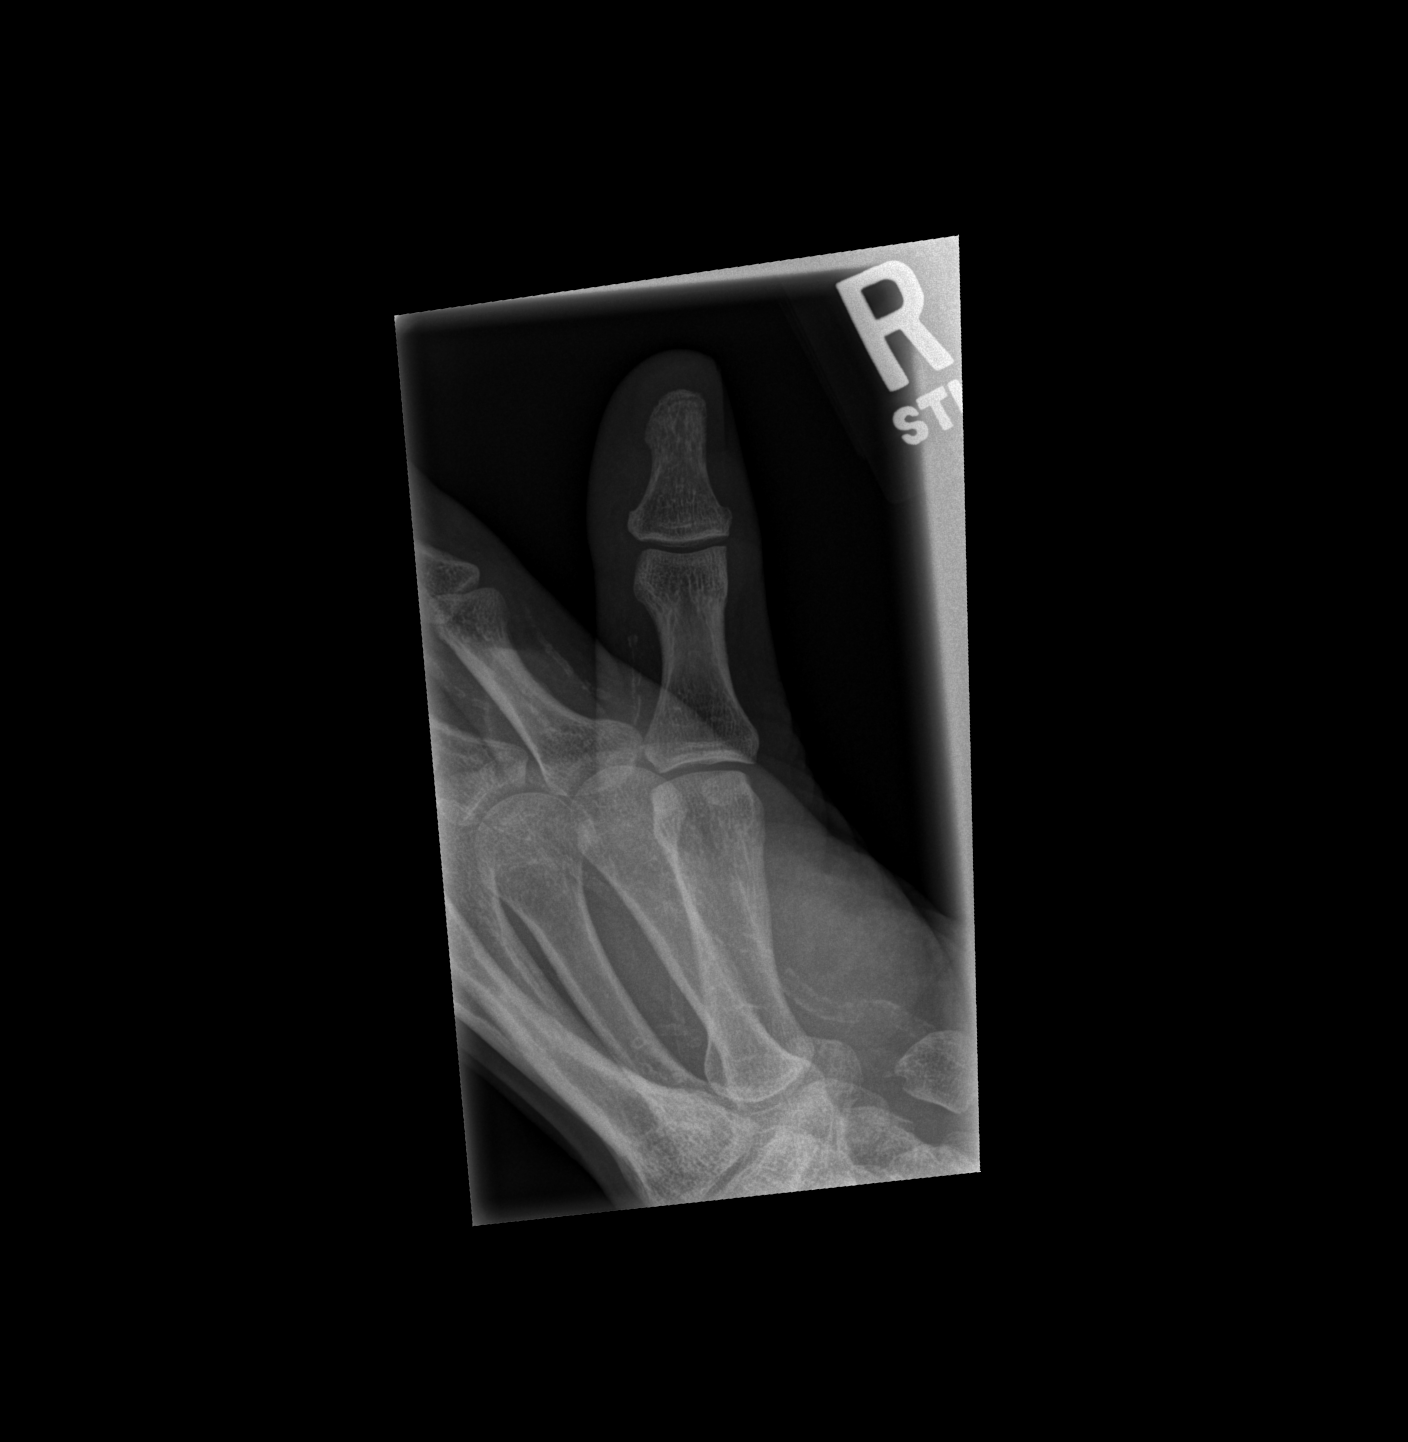

[x finger obl right]
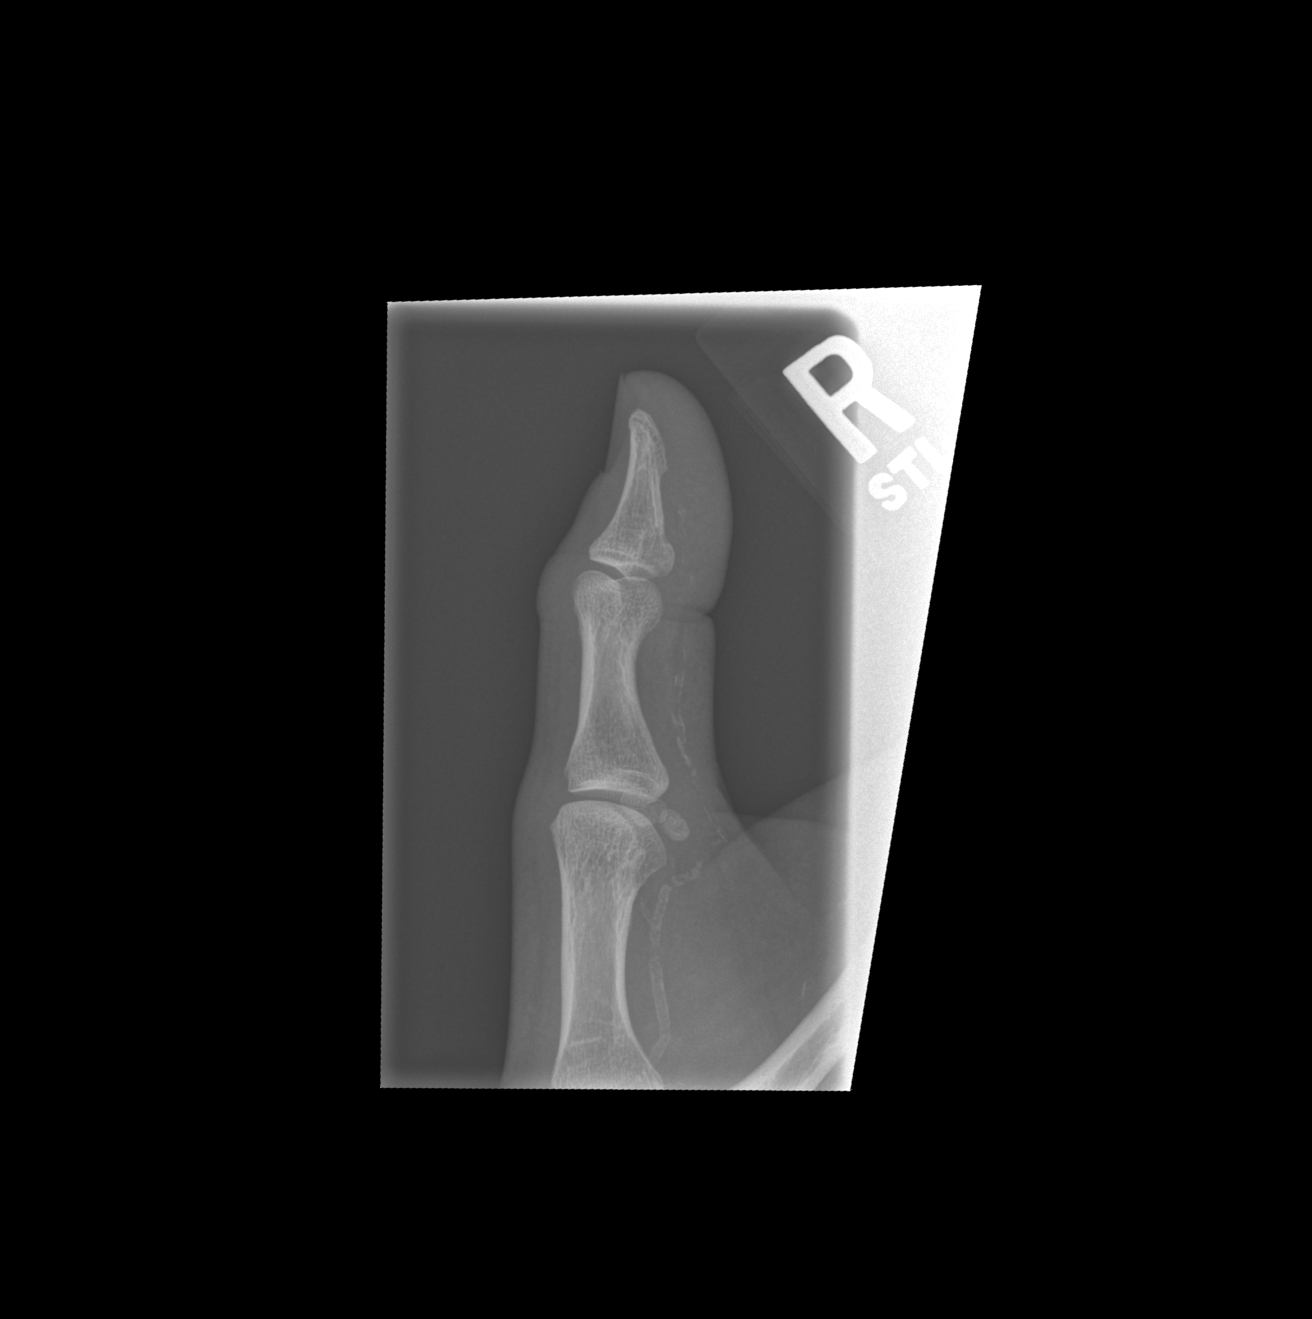

[x finger lat right]
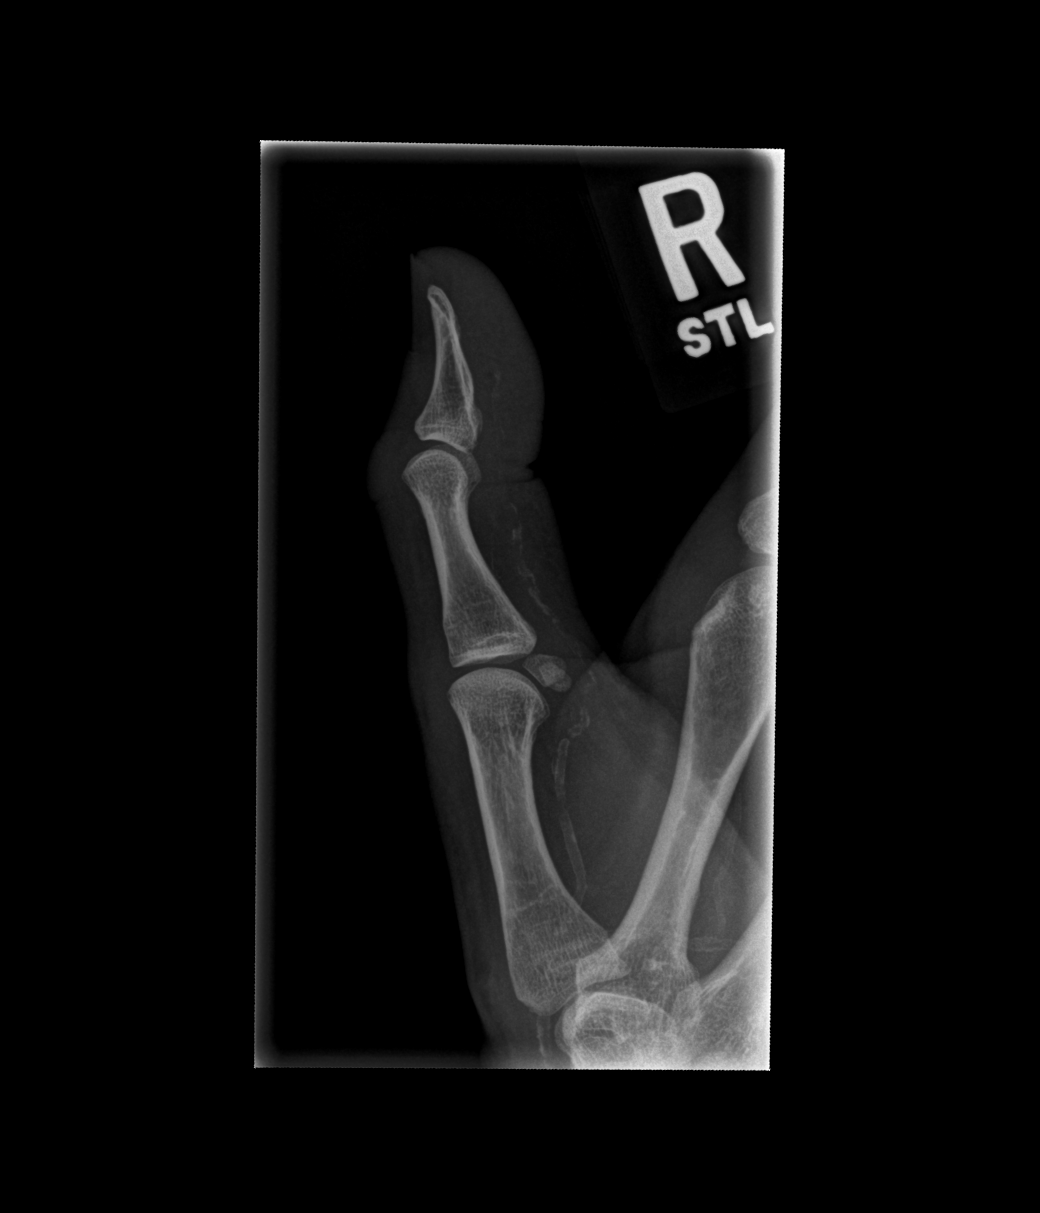

[3 of 3 positions shown; findings below may reference images not displayed]

FINDINGS: There is no evidence of fracture or dislocation. There is no
evidence of arthropathy or other focal bone abnormality. Severe
vascular calcifications. No subcutaneous gas or radiopaque foreign
bodies.
IMPRESSION: 1. No acute osseous process.
2. Severe vascular calcifications.

## 2020-07-23 DIAGNOSIS — L97522 Non-pressure chronic ulcer of other part of left foot with fat layer exposed: Secondary | ICD-10-CM | POA: Diagnosis not present

## 2020-08-04 DIAGNOSIS — Z794 Long term (current) use of insulin: Secondary | ICD-10-CM | POA: Diagnosis not present

## 2020-08-04 DIAGNOSIS — E10621 Type 1 diabetes mellitus with foot ulcer: Secondary | ICD-10-CM | POA: Diagnosis not present

## 2020-08-04 DIAGNOSIS — Z9641 Presence of insulin pump (external) (internal): Secondary | ICD-10-CM | POA: Diagnosis not present

## 2020-08-04 DIAGNOSIS — E10319 Type 1 diabetes mellitus with unspecified diabetic retinopathy without macular edema: Secondary | ICD-10-CM | POA: Diagnosis not present

## 2020-08-04 DIAGNOSIS — E1021 Type 1 diabetes mellitus with diabetic nephropathy: Secondary | ICD-10-CM | POA: Diagnosis not present

## 2020-08-04 DIAGNOSIS — L97529 Non-pressure chronic ulcer of other part of left foot with unspecified severity: Secondary | ICD-10-CM | POA: Diagnosis not present

## 2020-08-04 DIAGNOSIS — E1042 Type 1 diabetes mellitus with diabetic polyneuropathy: Secondary | ICD-10-CM | POA: Diagnosis not present

## 2020-08-04 DIAGNOSIS — Z89511 Acquired absence of right leg below knee: Secondary | ICD-10-CM | POA: Diagnosis not present

## 2020-08-13 DIAGNOSIS — L97522 Non-pressure chronic ulcer of other part of left foot with fat layer exposed: Secondary | ICD-10-CM | POA: Diagnosis not present

## 2020-08-20 DIAGNOSIS — I739 Peripheral vascular disease, unspecified: Secondary | ICD-10-CM | POA: Diagnosis not present

## 2020-08-20 DIAGNOSIS — N189 Chronic kidney disease, unspecified: Secondary | ICD-10-CM | POA: Diagnosis not present

## 2020-08-20 DIAGNOSIS — K219 Gastro-esophageal reflux disease without esophagitis: Secondary | ICD-10-CM | POA: Diagnosis not present

## 2020-08-20 DIAGNOSIS — D631 Anemia in chronic kidney disease: Secondary | ICD-10-CM | POA: Diagnosis not present

## 2020-08-20 DIAGNOSIS — I129 Hypertensive chronic kidney disease with stage 1 through stage 4 chronic kidney disease, or unspecified chronic kidney disease: Secondary | ICD-10-CM | POA: Diagnosis not present

## 2020-08-20 DIAGNOSIS — E10621 Type 1 diabetes mellitus with foot ulcer: Secondary | ICD-10-CM | POA: Diagnosis not present

## 2020-08-20 DIAGNOSIS — Z9225 Personal history of immunosupression therapy: Secondary | ICD-10-CM | POA: Diagnosis not present

## 2020-08-20 DIAGNOSIS — Z9483 Pancreas transplant status: Secondary | ICD-10-CM | POA: Diagnosis not present

## 2020-08-20 DIAGNOSIS — Z940 Kidney transplant status: Secondary | ICD-10-CM | POA: Diagnosis not present

## 2020-08-20 DIAGNOSIS — E1122 Type 2 diabetes mellitus with diabetic chronic kidney disease: Secondary | ICD-10-CM | POA: Diagnosis not present

## 2020-08-20 DIAGNOSIS — E11319 Type 2 diabetes mellitus with unspecified diabetic retinopathy without macular edema: Secondary | ICD-10-CM | POA: Diagnosis not present

## 2020-08-20 DIAGNOSIS — N2581 Secondary hyperparathyroidism of renal origin: Secondary | ICD-10-CM | POA: Diagnosis not present

## 2020-09-03 DIAGNOSIS — L97522 Non-pressure chronic ulcer of other part of left foot with fat layer exposed: Secondary | ICD-10-CM | POA: Diagnosis not present

## 2020-09-03 DIAGNOSIS — E1051 Type 1 diabetes mellitus with diabetic peripheral angiopathy without gangrene: Secondary | ICD-10-CM | POA: Diagnosis not present

## 2020-09-03 DIAGNOSIS — B351 Tinea unguium: Secondary | ICD-10-CM | POA: Diagnosis not present

## 2020-09-03 DIAGNOSIS — E1040 Type 1 diabetes mellitus with diabetic neuropathy, unspecified: Secondary | ICD-10-CM | POA: Diagnosis not present

## 2020-09-03 DIAGNOSIS — Z89519 Acquired absence of unspecified leg below knee: Secondary | ICD-10-CM | POA: Diagnosis not present

## 2020-09-03 DIAGNOSIS — Z89431 Acquired absence of right foot: Secondary | ICD-10-CM | POA: Diagnosis not present

## 2020-09-15 ENCOUNTER — Other Ambulatory Visit: Payer: Self-pay

## 2020-09-15 DIAGNOSIS — L98499 Non-pressure chronic ulcer of skin of other sites with unspecified severity: Secondary | ICD-10-CM

## 2020-09-17 ENCOUNTER — Other Ambulatory Visit: Payer: Self-pay

## 2020-09-17 ENCOUNTER — Ambulatory Visit (HOSPITAL_COMMUNITY)
Admission: RE | Admit: 2020-09-17 | Discharge: 2020-09-17 | Disposition: A | Discharge status: Home / Self Care | Payer: Medicare Other | Source: Ambulatory Visit | Attending: Vascular Surgery | Admitting: Vascular Surgery

## 2020-09-17 DIAGNOSIS — E1040 Type 1 diabetes mellitus with diabetic neuropathy, unspecified: Secondary | ICD-10-CM | POA: Diagnosis not present

## 2020-09-17 DIAGNOSIS — Z89519 Acquired absence of unspecified leg below knee: Secondary | ICD-10-CM | POA: Diagnosis not present

## 2020-09-17 DIAGNOSIS — L98499 Non-pressure chronic ulcer of skin of other sites with unspecified severity: Secondary | ICD-10-CM | POA: Diagnosis not present

## 2020-09-17 DIAGNOSIS — L97522 Non-pressure chronic ulcer of other part of left foot with fat layer exposed: Secondary | ICD-10-CM | POA: Diagnosis not present

## 2020-09-17 DIAGNOSIS — Z89431 Acquired absence of right foot: Secondary | ICD-10-CM | POA: Diagnosis not present

## 2020-10-01 DIAGNOSIS — L97522 Non-pressure chronic ulcer of other part of left foot with fat layer exposed: Secondary | ICD-10-CM | POA: Diagnosis not present

## 2020-10-01 DIAGNOSIS — E1051 Type 1 diabetes mellitus with diabetic peripheral angiopathy without gangrene: Secondary | ICD-10-CM | POA: Diagnosis not present

## 2020-10-01 DIAGNOSIS — E1040 Type 1 diabetes mellitus with diabetic neuropathy, unspecified: Secondary | ICD-10-CM | POA: Diagnosis not present

## 2020-10-01 DIAGNOSIS — Z89431 Acquired absence of right foot: Secondary | ICD-10-CM | POA: Diagnosis not present

## 2020-10-05 DIAGNOSIS — Z20822 Contact with and (suspected) exposure to covid-19: Secondary | ICD-10-CM | POA: Diagnosis not present

## 2020-10-08 ENCOUNTER — Other Ambulatory Visit: Payer: Self-pay

## 2020-10-08 ENCOUNTER — Encounter: Payer: Medicare Other | Admitting: Vascular Surgery

## 2020-10-08 ENCOUNTER — Ambulatory Visit (INDEPENDENT_AMBULATORY_CARE_PROVIDER_SITE_OTHER): Payer: Medicare Other | Admitting: Vascular Surgery

## 2020-10-08 ENCOUNTER — Encounter: Payer: Self-pay | Admitting: Vascular Surgery

## 2020-10-08 VITALS — BP 129/78 | HR 102 | Temp 98.4°F | Resp 20 | Ht 74.0 in | Wt 141.0 lb

## 2020-10-08 DIAGNOSIS — I70299 Other atherosclerosis of native arteries of extremities, unspecified extremity: Secondary | ICD-10-CM

## 2020-10-08 DIAGNOSIS — L97909 Non-pressure chronic ulcer of unspecified part of unspecified lower leg with unspecified severity: Secondary | ICD-10-CM
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
## 2020-10-19 NOTE — Progress Notes (Signed)
Surgical Instructions    Your procedure is scheduled on Friday September 23.  Report to [facility] Main Entrance "A" at 8 A.M., then check in with the Admitting office.  Call this number if you have problems the morning of surgery:  [phone]   If you have any questions prior to your surgery date call [phone]: Open Monday-Friday 8am-4pm    Remember:  Do not eat or drinkanything after midnight the night before your surgery      Take these medicines the morning of surgery with A SIP OF WATER : acetaminophen (TYLENOL)if needed gabapentin (NEURONTIN)  pantoprazole (PROTONIX predniSONE (DELTASONE)  sertraline (ZOLOFT) tacrolimus (PROGRAF) tamsulosin (FLOMAX)  As of today, STOP taking any Aspirin (unless otherwise instructed by your surgeon) Aleve, Naproxen, Ibuprofen, Motrin, Advil, Goody's, BC's, all herbal medications, fish oil, and all vitamins.   HOW TO MANAGE YOUR DIABETES BEFORE AND AFTER SURGERY  Why is it important to control my blood sugar before and after surgery? Improving blood sugar levels before and after surgery helps healing and can limit problems. A way of improving blood sugar control is eating a healthy diet by:  Eating less sugar and carbohydrates  Increasing activity/exercise  Talking with your doctor about reaching your blood sugar goals High blood sugars (greater than 180 mg/dL) can raise your risk of infections and slow your recovery, so you will need to focus on controlling your diabetes during the weeks before surgery. Make sure that the doctor who takes care of your diabetes knows about your planned surgery including the date and location.  How do I manage my blood sugar before surgery? Check your blood sugar at least 4 times a day, starting 2 days before surgery, to make sure that the level is not too high or low.  Check your blood sugar the morning of your surgery when you wake up and every 2 hours until you get to the Short Stay unit.  If  your blood sugar is less than 70 mg/dL, you will need to treat for low blood sugar: Do not take insulin. Treat a low blood sugar (less than 70 mg/dL) with  cup of clear juice (cranberry or apple), 4 glucose tablets, OR glucose gel. Recheck blood sugar in 15 minutes after treatment (to make sure it is greater than 70 mg/dL). If your blood sugar is not greater than 70 mg/dL on recheck, call [phone] for further instructions. Report your blood sugar to the short stay nurse when you get to Short Stay. Patients with Insulin Pumps    For patients with Insulin Pumps: Contact your diabetes doctor for specific instructions before surgery. Decrease basal insulin rates by 20% at midnight the night before surgery. Do not remove your insulin pump prior to arrival to short stay the morning of surgery.  Anesthesia will instruct you on when to remove your pump. Note that if your surgery is planned to be longer than 2 hours, your insulin pump will be removed and intravenous (IV) insulin will be started and managed by the nurses and anesthesiologist. You will be able to restart your insulin pump once you are awake and able to manage it. Make sure to bring insulin pump supplies to the hospital with you in case your site needs to be changed.  If you are admitted to the hospital after surgery: Your blood sugar will be checked by the staff and you will probably be given insulin after surgery (instead of oral diabetes medicines) to make sure you have good blood sugar  levels. The goal for blood sugar control after surgery is 80-180 mg/dL.           Do not wear jewelry or makeup Do not wear lotions, powders, perfumes/colognes, or deodorant. Do not shave 48 hours prior to surgery.  Men may shave face and neck. Do not bring valuables to the hospital. DO Not wear nail polish, gel polish, artificial nails, or any other type of covering on natural nails including finger and toenails. If patients have artificial  nails, gel coating, etc. that need to be removed by a nail salon please have this removed prior to surgery or surgery may need to be canceled/delayed if the surgeon/ anesthesia feels like the patient is unable to be adequately monitored.             [facility] is not responsible for any belongings or valuables.  Do NOT Smoke (Tobacco/Vaping)  24 hours prior to your procedure If you use a CPAP at night, you may bring your mask for your overnight stay.   Contacts, glasses, dentures or bridgework may not be worn into surgery, please bring cases for these belongings   For patients admitted to the hospital, discharge time will be determined by your treatment team.   Patients discharged the day of surgery will not be allowed to drive home, and someone needs to stay with them for 24 hours.  NO VISITORS WILL BE ALLOWED IN PRE-OP WHERE PATIENTS GET READY FOR SURGERY.  ONLY 1 SUPPORT PERSON MAY BE PRESENT WHILE YOU ARE IN SURGERY.  IF YOU ARE TO BE ADMITTED, ONCE YOU ARE IN YOUR ROOM YOU WILL BE ALLOWED TWO (2) VISITORS.  Minor children may have two parents present. Special consideration for safety and communication needs will be reviewed on a case by case basis.  Special instructions:    Oral Hygiene is also important to reduce your risk of infection.  Remember - BRUSH YOUR TEETH THE MORNING OF SURGERY WITH YOUR REGULAR TOOTHPASTE   [place]- Preparing For Surgery  Before surgery, you can play an important role. Because skin is not sterile, your skin needs to be as free of germs as possible. You can reduce the number of germs on your skin by washing with CHG (chlorahexidine gluconate) Soap before surgery.  CHG is an antiseptic cleaner which kills germs and bonds with the skin to continue killing germs even after washing.     Please do not use if you have an allergy to CHG or antibacterial soaps. If your skin becomes reddened/irritated stop using the CHG.  Do not shave (including legs and  underarms) for at least 48 hours prior to first CHG shower. It is OK to shave your face.  Please follow these instructions carefully.     Shower the NIGHT BEFORE SURGERY and the MORNING OF SURGERY with CHG Soap.   If you chose to wash your hair, wash your hair first as usual with your normal shampoo. After you shampoo, rinse your hair and body thoroughly to remove the shampoo.  Then [employer] and genitals (private parts) with your normal soap and rinse thoroughly to remove soap.  After that Use CHG Soap as you would any other liquid soap. You can apply CHG directly to the skin and wash gently with a scrungie or a clean washcloth.   Apply the CHG Soap to your body ONLY FROM THE NECK DOWN.  Do not use on open wounds or open sores. Avoid contact with your eyes, ears, mouth and  genitals (private parts). Wash Face and genitals (private parts)  with your normal soap.   Wash thoroughly, paying special attention to the area where your surgery will be performed.  Thoroughly rinse your body with warm water from the neck down.  DO NOT shower/wash with your normal soap after using and rinsing off the CHG Soap.  Pat yourself dry with a CLEAN TOWEL.  Wear CLEAN PAJAMAS to bed the night before surgery  Place CLEAN SHEETS on your bed the night before your surgery  DO NOT SLEEP WITH PETS.   Day of Surgery:  Take a shower with CHG soap. Wear Clean/Comfortable clothing the morning of surgery Do not apply any deodorants/lotions.   Remember to brush your teeth WITH YOUR REGULAR TOOTHPASTE.   Please read over the following fact sheets that you were given.

## 2020-10-20 ENCOUNTER — Encounter (HOSPITAL_COMMUNITY): Payer: Self-pay

## 2020-10-20 ENCOUNTER — Encounter (HOSPITAL_COMMUNITY)
Admission: RE | Admit: 2020-10-20 | Discharge: 2020-10-20 | Disposition: A | Discharge status: Home / Self Care | Payer: Medicare Other | Source: Ambulatory Visit | Attending: Vascular Surgery | Admitting: Vascular Surgery

## 2020-10-20 ENCOUNTER — Other Ambulatory Visit: Payer: Self-pay

## 2020-10-20 DIAGNOSIS — D631 Anemia in chronic kidney disease: Secondary | ICD-10-CM | POA: Diagnosis not present

## 2020-10-20 DIAGNOSIS — E119 Type 2 diabetes mellitus without complications: Secondary | ICD-10-CM | POA: Insufficient documentation

## 2020-10-20 DIAGNOSIS — E1051 Type 1 diabetes mellitus with diabetic peripheral angiopathy without gangrene: Secondary | ICD-10-CM | POA: Diagnosis not present

## 2020-10-20 DIAGNOSIS — Z9483 Pancreas transplant status: Secondary | ICD-10-CM | POA: Diagnosis not present

## 2020-10-20 DIAGNOSIS — Z7901 Long term (current) use of anticoagulants: Secondary | ICD-10-CM | POA: Insufficient documentation

## 2020-10-20 DIAGNOSIS — Z01818 Encounter for other preprocedural examination: Secondary | ICD-10-CM | POA: Insufficient documentation

## 2020-10-20 DIAGNOSIS — Z20822 Contact with and (suspected) exposure to covid-19: Secondary | ICD-10-CM | POA: Insufficient documentation

## 2020-10-20 DIAGNOSIS — Z940 Kidney transplant status: Secondary | ICD-10-CM | POA: Diagnosis not present

## 2020-10-20 DIAGNOSIS — E1022 Type 1 diabetes mellitus with diabetic chronic kidney disease: Secondary | ICD-10-CM | POA: Diagnosis not present

## 2020-10-20 DIAGNOSIS — Z794 Long term (current) use of insulin: Secondary | ICD-10-CM | POA: Insufficient documentation

## 2020-10-20 DIAGNOSIS — Z5181 Encounter for therapeutic drug level monitoring: Secondary | ICD-10-CM | POA: Insufficient documentation

## 2020-10-20 DIAGNOSIS — E103593 Type 1 diabetes mellitus with proliferative diabetic retinopathy without macular edema, bilateral: Secondary | ICD-10-CM | POA: Diagnosis not present

## 2020-10-20 DIAGNOSIS — E1040 Type 1 diabetes mellitus with diabetic neuropathy, unspecified: Secondary | ICD-10-CM | POA: Diagnosis not present

## 2020-10-20 DIAGNOSIS — I70201 Unspecified atherosclerosis of native arteries of extremities, right leg: Secondary | ICD-10-CM | POA: Diagnosis not present

## 2020-10-20 DIAGNOSIS — I70262 Atherosclerosis of native arteries of extremities with gangrene, left leg: Secondary | ICD-10-CM | POA: Diagnosis not present

## 2020-10-20 DIAGNOSIS — E10621 Type 1 diabetes mellitus with foot ulcer: Secondary | ICD-10-CM | POA: Diagnosis not present

## 2020-10-20 DIAGNOSIS — D84821 Immunodeficiency due to drugs: Secondary | ICD-10-CM | POA: Diagnosis not present

## 2020-10-20 HISTORY — DX: Depression, unspecified: F32.A

## 2020-10-20 LAB — COMPREHENSIVE METABOLIC PANEL
ALT: 13 U/L (ref 0–44)
AST: 14 U/L — ABNORMAL LOW (ref 15–41)
Albumin: 3.9 g/dL (ref 3.5–5.0)
Alkaline Phosphatase: 37 U/L — ABNORMAL LOW (ref 38–126)
Anion gap: 9 (ref 5–15)
BUN: 27 mg/dL — ABNORMAL HIGH (ref 6–20)
CO2: 24 mmol/L (ref 22–32)
Calcium: 9 mg/dL (ref 8.9–10.3)
Chloride: 105 mmol/L (ref 98–111)
Creatinine, Ser: 1.89 mg/dL — ABNORMAL HIGH (ref 0.61–1.24)
GFR, Estimated: 43 mL/min — ABNORMAL LOW (ref >60)
Glucose, Bld: 89 mg/dL (ref 70–99)
Potassium: 4.2 mmol/L (ref 3.5–5.1)
Sodium: 138 mmol/L (ref 135–145)
Total Bilirubin: 0.6 mg/dL (ref 0.3–1.2)
Total Protein: 6.8 g/dL (ref 6.5–8.1)

## 2020-10-20 LAB — URINALYSIS, ROUTINE W REFLEX MICROSCOPIC
Bilirubin Urine: NEGATIVE
Glucose, UA: 100 mg/dL — AB
Hgb urine dipstick: NEGATIVE
Ketones, ur: NEGATIVE mg/dL
Leukocytes,Ua: NEGATIVE
Nitrite: NEGATIVE
Protein, ur: NEGATIVE mg/dL
Specific Gravity, Urine: 1.01 (ref 1.005–1.030)
pH: 6.5 (ref 5.0–8.0)

## 2020-10-20 LAB — CBC
HCT: 37.8 % — ABNORMAL LOW (ref 39.0–52.0)
Hemoglobin: 11.8 g/dL — ABNORMAL LOW (ref 13.0–17.0)
MCH: 28 pg (ref 26.0–34.0)
MCHC: 31.2 g/dL (ref 30.0–36.0)
MCV: 89.8 fL (ref 80.0–100.0)
Platelets: 193 10*3/uL (ref 150–400)
RBC: 4.21 MIL/uL — ABNORMAL LOW (ref 4.22–5.81)
RDW: 13.8 % (ref 11.5–15.5)
WBC: 8 10*3/uL (ref 4.0–10.5)
nRBC: 0 % (ref 0.0–0.2)

## 2020-10-20 LAB — HEMOGLOBIN A1C
Hgb A1c MFr Bld: 7.3 % — ABNORMAL HIGH (ref 4.8–5.6)
Mean Plasma Glucose: 162.81 mg/dL

## 2020-10-20 LAB — APTT: aPTT: 31 seconds (ref 24–36)

## 2020-10-20 LAB — GLUCOSE, CAPILLARY: Glucose-Capillary: 178 mg/dL — ABNORMAL HIGH (ref 70–99)

## 2020-10-20 LAB — PROTIME-INR
INR: 1.1 (ref 0.8–1.2)
Prothrombin Time: 14 seconds (ref 11.4–15.2)

## 2020-10-20 LAB — SURGICAL PCR SCREEN
MRSA, PCR: NEGATIVE
Staphylococcus aureus: NEGATIVE

## 2020-10-20 LAB — SARS CORONAVIRUS 2 (TAT 6-24 HRS): SARS Coronavirus 2: NEGATIVE

## 2020-10-20 NOTE — Progress Notes (Addendum)
PCP - Dr. Edrick Oh Cardiologist - Has seen Dr. Gwenlyn Found in the past 2-3 years. Has had vascular studies done.  Doesn't see regularly.   Chest x-ray - Not indicated EKG - 10/20/20 Stress Test - 03/2003 ECHO - 05/08/2003  Cardiac Cath - denies  Sleep Study - Yes in 2007 no OSA  DM - Type I CBG at PAT appt 178 Fasting Blood Sugar - averages 100-200 Checks Blood Sugar has Dexcom G6 continuous glucose monitor. Also has the continuous insulin pump, basal rate for pump is 1u/hour from 9a-12p and 12pm - 9am 0.4u/hour. Bolus as needed.    Contacted diabetic coordinator [place] regarding pump. Patient aware to decrease setting by 20%.   Aspirin Instructions:  Surgeon instructed patient to continue aspirin through day of surgery.   COVID TEST- 10/20/20   Anesthesia review: No  Patient denies shortness of breath, fever, cough and chest pain at PAT appointment   All instructions explained to the patient, with a verbal understanding of the material. Patient agrees to go over the instructions while at home for a better understanding. Patient also instructed to wear a mask while in public after being tested for COVID-19. The opportunity to ask questions was provided.

## 2020-10-22 ENCOUNTER — Encounter: Payer: Medicare Other | Admitting: Vascular Surgery

## 2020-10-22 NOTE — Progress Notes (Signed)
voiced understanding of new arrival time of 1045

## 2020-10-23 ENCOUNTER — Other Ambulatory Visit: Payer: Self-pay

## 2020-10-23 ENCOUNTER — Encounter (HOSPITAL_COMMUNITY)
Admission: RE | Disposition: A | Discharge status: Home Health | Payer: Self-pay | Source: Home / Self Care | Attending: Vascular Surgery

## 2020-10-23 ENCOUNTER — Inpatient Hospital Stay (HOSPITAL_COMMUNITY): Payer: Medicare Other | Admitting: Anesthesiology

## 2020-10-23 ENCOUNTER — Encounter (HOSPITAL_COMMUNITY): Payer: Self-pay | Admitting: Vascular Surgery

## 2020-10-23 ENCOUNTER — Inpatient Hospital Stay (HOSPITAL_COMMUNITY)
Admission: RE | Admit: 2020-10-23 | Discharge: 2020-10-26 | DRG: 240 | Disposition: A | Discharge status: Home Health | Payer: Medicare Other | Attending: Vascular Surgery | Admitting: Vascular Surgery

## 2020-10-23 DIAGNOSIS — E1040 Type 1 diabetes mellitus with diabetic neuropathy, unspecified: Secondary | ICD-10-CM | POA: Diagnosis present

## 2020-10-23 DIAGNOSIS — K219 Gastro-esophageal reflux disease without esophagitis: Secondary | ICD-10-CM | POA: Diagnosis present

## 2020-10-23 DIAGNOSIS — Z808 Family history of malignant neoplasm of other organs or systems: Secondary | ICD-10-CM

## 2020-10-23 DIAGNOSIS — Z20822 Contact with and (suspected) exposure to covid-19: Secondary | ICD-10-CM | POA: Diagnosis present

## 2020-10-23 DIAGNOSIS — Z9641 Presence of insulin pump (external) (internal): Secondary | ICD-10-CM | POA: Diagnosis present

## 2020-10-23 DIAGNOSIS — Z9713 Presence of artificial right leg (complete) (partial): Secondary | ICD-10-CM

## 2020-10-23 DIAGNOSIS — L929 Granulomatous disorder of the skin and subcutaneous tissue, unspecified: Secondary | ICD-10-CM | POA: Diagnosis not present

## 2020-10-23 DIAGNOSIS — Z794 Long term (current) use of insulin: Secondary | ICD-10-CM | POA: Diagnosis not present

## 2020-10-23 DIAGNOSIS — Z7982 Long term (current) use of aspirin: Secondary | ICD-10-CM

## 2020-10-23 DIAGNOSIS — Z8679 Personal history of other diseases of the circulatory system: Secondary | ICD-10-CM

## 2020-10-23 DIAGNOSIS — G4733 Obstructive sleep apnea (adult) (pediatric): Secondary | ICD-10-CM | POA: Diagnosis present

## 2020-10-23 DIAGNOSIS — L97929 Non-pressure chronic ulcer of unspecified part of left lower leg with unspecified severity: Secondary | ICD-10-CM | POA: Diagnosis not present

## 2020-10-23 DIAGNOSIS — Z89519 Acquired absence of unspecified leg below knee: Secondary | ICD-10-CM

## 2020-10-23 DIAGNOSIS — L97429 Non-pressure chronic ulcer of left heel and midfoot with unspecified severity: Secondary | ICD-10-CM | POA: Diagnosis present

## 2020-10-23 DIAGNOSIS — Z79899 Other long term (current) drug therapy: Secondary | ICD-10-CM

## 2020-10-23 DIAGNOSIS — D84821 Immunodeficiency due to drugs: Secondary | ICD-10-CM | POA: Diagnosis present

## 2020-10-23 DIAGNOSIS — G8918 Other acute postprocedural pain: Secondary | ICD-10-CM | POA: Diagnosis not present

## 2020-10-23 DIAGNOSIS — E103593 Type 1 diabetes mellitus with proliferative diabetic retinopathy without macular edema, bilateral: Secondary | ICD-10-CM | POA: Diagnosis present

## 2020-10-23 DIAGNOSIS — I70201 Unspecified atherosclerosis of native arteries of extremities, right leg: Secondary | ICD-10-CM | POA: Diagnosis present

## 2020-10-23 DIAGNOSIS — F32A Depression, unspecified: Secondary | ICD-10-CM | POA: Diagnosis present

## 2020-10-23 DIAGNOSIS — D631 Anemia in chronic kidney disease: Secondary | ICD-10-CM | POA: Diagnosis present

## 2020-10-23 DIAGNOSIS — E1022 Type 1 diabetes mellitus with diabetic chronic kidney disease: Secondary | ICD-10-CM | POA: Diagnosis present

## 2020-10-23 DIAGNOSIS — Z89511 Acquired absence of right leg below knee: Secondary | ICD-10-CM

## 2020-10-23 DIAGNOSIS — E1051 Type 1 diabetes mellitus with diabetic peripheral angiopathy without gangrene: Principal | ICD-10-CM | POA: Diagnosis present

## 2020-10-23 DIAGNOSIS — I10 Essential (primary) hypertension: Secondary | ICD-10-CM | POA: Diagnosis present

## 2020-10-23 DIAGNOSIS — L97529 Non-pressure chronic ulcer of other part of left foot with unspecified severity: Secondary | ICD-10-CM | POA: Diagnosis not present

## 2020-10-23 DIAGNOSIS — I70262 Atherosclerosis of native arteries of extremities with gangrene, left leg: Secondary | ICD-10-CM | POA: Diagnosis present

## 2020-10-23 DIAGNOSIS — Z940 Kidney transplant status: Secondary | ICD-10-CM

## 2020-10-23 DIAGNOSIS — E10621 Type 1 diabetes mellitus with foot ulcer: Secondary | ICD-10-CM | POA: Diagnosis present

## 2020-10-23 DIAGNOSIS — Z9483 Pancreas transplant status: Secondary | ICD-10-CM

## 2020-10-23 DIAGNOSIS — I739 Peripheral vascular disease, unspecified: Secondary | ICD-10-CM | POA: Diagnosis not present

## 2020-10-23 HISTORY — PX: AMPUTATION: SHX166

## 2020-10-23 LAB — TYPE AND SCREEN
ABO/RH(D): B NEG
Antibody Screen: NEGATIVE
Unit division: 0

## 2020-10-23 LAB — CBC
HCT: 33 % — ABNORMAL LOW (ref 39.0–52.0)
Hemoglobin: 10.5 g/dL — ABNORMAL LOW (ref 13.0–17.0)
MCH: 28.2 pg (ref 26.0–34.0)
MCHC: 31.8 g/dL (ref 30.0–36.0)
MCV: 88.7 fL (ref 80.0–100.0)
Platelets: 195 10*3/uL (ref 150–400)
RBC: 3.72 MIL/uL — ABNORMAL LOW (ref 4.22–5.81)
RDW: 13.8 % (ref 11.5–15.5)
WBC: 7.2 10*3/uL (ref 4.0–10.5)
nRBC: 0 % (ref 0.0–0.2)

## 2020-10-23 LAB — GLUCOSE, CAPILLARY
Glucose-Capillary: 238 mg/dL — ABNORMAL HIGH (ref 70–99)
Glucose-Capillary: 205 mg/dL — ABNORMAL HIGH (ref 70–99)
Glucose-Capillary: 241 mg/dL — ABNORMAL HIGH (ref 70–99)
Glucose-Capillary: 245 mg/dL — ABNORMAL HIGH (ref 70–99)
Glucose-Capillary: 158 mg/dL — ABNORMAL HIGH (ref 70–99)

## 2020-10-23 LAB — BPAM RBC
Blood Product Expiration Date: 202210032359
Unit Type and Rh: 1700

## 2020-10-23 LAB — ABO/RH: ABO/RH(D): B NEG

## 2020-10-23 LAB — CREATININE, SERUM
Creatinine, Ser: 2.22 mg/dL — ABNORMAL HIGH (ref 0.61–1.24)
GFR, Estimated: 35 mL/min — ABNORMAL LOW (ref >60)

## 2020-10-23 SURGERY — AMPUTATION BELOW KNEE
Anesthesia: General | Site: Knee | Laterality: Left

## 2020-10-23 MED ORDER — PHENOL 1.4 % MT LIQD: 1.0000 | OROMUCOSAL | Status: DC | PRN

## 2020-10-23 MED ORDER — ACETAMINOPHEN 500 MG PO TABS
1000.0000 mg | ORAL_TABLET | Freq: Once | ORAL | Status: AC
  Administered 2020-10-23: 1000 mg via ORAL
  Filled 2020-10-23: qty 2

## 2020-10-23 MED ORDER — EPHEDRINE SULFATE-NACL 50-0.9 MG/10ML-% IV SOSY
PREFILLED_SYRINGE | INTRAVENOUS | Status: DC | PRN
  Administered 2020-10-23 (×3): 5 mg via INTRAVENOUS
  Administered 2020-10-23: 2.5 mg via INTRAVENOUS
  Administered 2020-10-23: 5 mg via INTRAVENOUS

## 2020-10-23 MED ORDER — HYDRALAZINE HCL 20 MG/ML IJ SOLN
5.0000 mg | INTRAMUSCULAR | Status: DC | PRN
Start: 2020-10-23 — End: 2020-10-26
  Filled 2020-10-23: qty 1

## 2020-10-23 MED ORDER — PHENYLEPHRINE 40 MCG/ML (10ML) SYRINGE FOR IV PUSH (FOR BLOOD PRESSURE SUPPORT)
PREFILLED_SYRINGE | INTRAVENOUS | Status: DC | PRN
  Administered 2020-10-23 (×4): 80 ug via INTRAVENOUS
  Administered 2020-10-23: 40 ug via INTRAVENOUS

## 2020-10-23 MED ORDER — GUAIFENESIN-DM 100-10 MG/5ML PO SYRP: 15.0000 mL | ORAL_SOLUTION | ORAL | Status: DC | PRN

## 2020-10-23 MED ORDER — MORPHINE SULFATE (PF) 2 MG/ML IV SOLN
0.5000 mg | INTRAVENOUS | Status: DC | PRN
  Administered 2020-10-24 (×2): 1 mg via INTRAVENOUS
  Filled 2020-10-23 (×2): qty 1

## 2020-10-23 MED ORDER — MIDAZOLAM HCL 2 MG/2ML IJ SOLN
INTRAMUSCULAR | Status: AC
  Administered 2020-10-23: 1 mg via INTRAVENOUS
  Filled 2020-10-23: qty 2

## 2020-10-23 MED ORDER — 0.9 % SODIUM CHLORIDE (POUR BTL) OPTIME
TOPICAL | Status: DC | PRN
  Administered 2020-10-23: 1000 mL

## 2020-10-23 MED ORDER — FENTANYL CITRATE (PF) 100 MCG/2ML IJ SOLN
INTRAMUSCULAR | Status: AC
  Administered 2020-10-23: 50 ug via INTRAVENOUS
  Filled 2020-10-23: qty 2

## 2020-10-23 MED ORDER — PROMETHAZINE HCL 25 MG/ML IJ SOLN: 6.2500 mg | INTRAMUSCULAR | Status: DC | PRN

## 2020-10-23 MED ORDER — ACETAMINOPHEN 325 MG PO TABS: 325.0000 mg | ORAL_TABLET | Freq: Four times a day (QID) | ORAL | Status: DC | PRN

## 2020-10-23 MED ORDER — SCOPOLAMINE 1 MG/3DAYS TD PT72
1.0000 | MEDICATED_PATCH | TRANSDERMAL | Status: DC
  Administered 2020-10-23: 1.5 mg via TRANSDERMAL
  Filled 2020-10-23: qty 1

## 2020-10-23 MED ORDER — LABETALOL HCL 5 MG/ML IV SOLN: 10.0000 mg | INTRAVENOUS | Status: DC | PRN

## 2020-10-23 MED ORDER — HYDROCODONE-ACETAMINOPHEN 5-325 MG PO TABS
1.0000 | ORAL_TABLET | ORAL | Status: DC | PRN
Start: 2020-10-23 — End: 2020-10-26

## 2020-10-23 MED ORDER — PROPOFOL 500 MG/50ML IV EMUL
INTRAVENOUS | Status: DC | PRN
  Administered 2020-10-23: 25 ug/kg/min via INTRAVENOUS

## 2020-10-23 MED ORDER — CEFAZOLIN SODIUM-DEXTROSE 2-4 GM/100ML-% IV SOLN
2.0000 g | INTRAVENOUS | Status: AC
  Administered 2020-10-23: 2 g via INTRAVENOUS
  Filled 2020-10-23: qty 100

## 2020-10-23 MED ORDER — CHLORHEXIDINE GLUCONATE CLOTH 2 % EX PADS: 6.0000 | MEDICATED_PAD | Freq: Once | CUTANEOUS | Status: DC

## 2020-10-23 MED ORDER — ALUM & MAG HYDROXIDE-SIMETH 200-200-20 MG/5ML PO SUSP: 15.0000 mL | ORAL | Status: DC | PRN

## 2020-10-23 MED ORDER — FENTANYL CITRATE (PF) 100 MCG/2ML IJ SOLN: 100.0000 ug | Freq: Once | INTRAMUSCULAR | Status: AC

## 2020-10-23 MED ORDER — CHLORHEXIDINE GLUCONATE 0.12 % MT SOLN
15.0000 mL | Freq: Once | OROMUCOSAL | Status: AC
  Administered 2020-10-23: 15 mL via OROMUCOSAL
  Filled 2020-10-23: qty 15

## 2020-10-23 MED ORDER — MIDAZOLAM HCL 2 MG/2ML IJ SOLN
INTRAMUSCULAR | Status: AC
  Filled 2020-10-23: qty 2

## 2020-10-23 MED ORDER — SERTRALINE HCL 100 MG PO TABS
100.0000 mg | ORAL_TABLET | Freq: Every morning | ORAL | Status: DC
  Administered 2020-10-24 – 2020-10-26 (×3): 100 mg via ORAL
  Filled 2020-10-23 (×3): qty 1

## 2020-10-23 MED ORDER — ONDANSETRON HCL 4 MG/2ML IJ SOLN
4.0000 mg | Freq: Four times a day (QID) | INTRAMUSCULAR | Status: DC | PRN
  Administered 2020-10-24 (×4): 4 mg via INTRAVENOUS
  Filled 2020-10-23 (×4): qty 2

## 2020-10-23 MED ORDER — PHENYLEPHRINE HCL-NACL 20-0.9 MG/250ML-% IV SOLN
INTRAVENOUS | Status: DC | PRN
  Administered 2020-10-23: 30 ug/min via INTRAVENOUS

## 2020-10-23 MED ORDER — ONDANSETRON HCL 4 MG/2ML IJ SOLN
INTRAMUSCULAR | Status: DC | PRN
  Administered 2020-10-23: 4 mg via INTRAVENOUS

## 2020-10-23 MED ORDER — PHENYLEPHRINE HCL (PRESSORS) 10 MG/ML IV SOLN
INTRAVENOUS | Status: DC | PRN
  Administered 2020-10-23: 100 ug via INTRAVENOUS

## 2020-10-23 MED ORDER — DOCUSATE SODIUM 100 MG PO CAPS
100.0000 mg | ORAL_CAPSULE | Freq: Every day | ORAL | Status: DC
  Filled 2020-10-23 (×3): qty 1

## 2020-10-23 MED ORDER — INSULIN ASPART 100 UNIT/ML IJ SOLN
0.0000 [IU] | Freq: Three times a day (TID) | INTRAMUSCULAR | Status: DC
  Administered 2020-10-24 (×2): 3 [IU] via SUBCUTANEOUS
  Administered 2020-10-24: 2 [IU] via SUBCUTANEOUS
  Administered 2020-10-25 (×2): 3 [IU] via SUBCUTANEOUS
  Administered 2020-10-26: 2.6 [IU] via SUBCUTANEOUS

## 2020-10-23 MED ORDER — FENTANYL CITRATE (PF) 100 MCG/2ML IJ SOLN: 25.0000 ug | INTRAMUSCULAR | Status: DC | PRN

## 2020-10-23 MED ORDER — MIDAZOLAM HCL 2 MG/2ML IJ SOLN
INTRAMUSCULAR | Status: AC
  Administered 2020-10-23: 2 mg via INTRAVENOUS
  Filled 2020-10-23: qty 2

## 2020-10-23 MED ORDER — SODIUM CHLORIDE 0.9 % IV SOLN: INTRAVENOUS | Status: AC

## 2020-10-23 MED ORDER — ASPIRIN EC 81 MG PO TBEC
81.0000 mg | DELAYED_RELEASE_TABLET | Freq: Every day | ORAL | Status: DC
  Administered 2020-10-24 – 2020-10-26 (×3): 81 mg via ORAL
  Filled 2020-10-23 (×3): qty 1

## 2020-10-23 MED ORDER — ORAL CARE MOUTH RINSE: 15.0000 mL | Freq: Once | OROMUCOSAL | Status: AC

## 2020-10-23 MED ORDER — PROPOFOL 10 MG/ML IV BOLUS
INTRAVENOUS | Status: DC | PRN
  Administered 2020-10-23: 50 mg via INTRAVENOUS
  Administered 2020-10-23: 150 mg via INTRAVENOUS

## 2020-10-23 MED ORDER — AMISULPRIDE (ANTIEMETIC) 5 MG/2ML IV SOLN: 10.0000 mg | Freq: Once | INTRAVENOUS | Status: DC | PRN

## 2020-10-23 MED ORDER — MAGNESIUM SULFATE 2 GM/50ML IV SOLN: 2.0000 g | Freq: Every day | INTRAVENOUS | Status: DC | PRN

## 2020-10-23 MED ORDER — TACROLIMUS 1 MG PO CAPS
2.0000 mg | ORAL_CAPSULE | Freq: Every day | ORAL | Status: DC
  Administered 2020-10-24 – 2020-10-25 (×3): 2 mg via ORAL
  Filled 2020-10-23 (×3): qty 2

## 2020-10-23 MED ORDER — MIDAZOLAM HCL 2 MG/2ML IJ SOLN: 1.0000 mg | Freq: Once | INTRAMUSCULAR | Status: AC

## 2020-10-23 MED ORDER — MYCOPHENOLATE SODIUM 180 MG PO TBEC
720.0000 mg | DELAYED_RELEASE_TABLET | Freq: Two times a day (BID) | ORAL | Status: DC
Start: 2020-10-23 — End: 2020-10-26
  Administered 2020-10-23 – 2020-10-26 (×6): 720 mg via ORAL
  Filled 2020-10-23 (×6): qty 4

## 2020-10-23 MED ORDER — FENTANYL CITRATE (PF) 100 MCG/2ML IJ SOLN: 50.0000 ug | Freq: Once | INTRAMUSCULAR | Status: AC

## 2020-10-23 MED ORDER — PANTOPRAZOLE SODIUM 40 MG PO TBEC
40.0000 mg | DELAYED_RELEASE_TABLET | Freq: Every day | ORAL | Status: DC
  Administered 2020-10-24 – 2020-10-26 (×3): 40 mg via ORAL
  Filled 2020-10-23 (×3): qty 1

## 2020-10-23 MED ORDER — FENTANYL CITRATE (PF) 100 MCG/2ML IJ SOLN
INTRAMUSCULAR | Status: AC
  Administered 2020-10-23: 100 ug via INTRAVENOUS
  Filled 2020-10-23: qty 2

## 2020-10-23 MED ORDER — TACROLIMUS 1 MG PO CAPS
3.0000 mg | ORAL_CAPSULE | Freq: Every day | ORAL | Status: DC
  Administered 2020-10-24 – 2020-10-26 (×3): 3 mg via ORAL
  Filled 2020-10-23 (×3): qty 3

## 2020-10-23 MED ORDER — HEPARIN SODIUM (PORCINE) 5000 UNIT/ML IJ SOLN
5000.0000 [IU] | Freq: Three times a day (TID) | INTRAMUSCULAR | Status: DC
  Filled 2020-10-23 (×3): qty 1

## 2020-10-23 MED ORDER — MIDAZOLAM HCL 2 MG/2ML IJ SOLN
INTRAMUSCULAR | Status: DC | PRN
  Administered 2020-10-23: 2 mg via INTRAVENOUS

## 2020-10-23 MED ORDER — BUPIVACAINE HCL (PF) 0.5 % IJ SOLN
INTRAMUSCULAR | Status: DC | PRN
  Administered 2020-10-23: 13 mL via PERINEURAL
  Administered 2020-10-23: 7 mL via PERINEURAL

## 2020-10-23 MED ORDER — MIDAZOLAM HCL 2 MG/2ML IJ SOLN: 2.0000 mg | Freq: Once | INTRAMUSCULAR | Status: AC

## 2020-10-23 MED ORDER — BUPIVACAINE LIPOSOME 1.3 % IJ SUSP
INTRAMUSCULAR | Status: DC | PRN
  Administered 2020-10-23: 7 mL
  Administered 2020-10-23: 3 mL

## 2020-10-23 MED ORDER — BACITRACIN ZINC 500 UNIT/GM EX OINT
TOPICAL_OINTMENT | CUTANEOUS | Status: AC
  Filled 2020-10-23: qty 28.35

## 2020-10-23 MED ORDER — POTASSIUM CHLORIDE CRYS ER 20 MEQ PO TBCR: 20.0000 meq | EXTENDED_RELEASE_TABLET | Freq: Every day | ORAL | Status: DC | PRN

## 2020-10-23 MED ORDER — TAMSULOSIN HCL 0.4 MG PO CAPS
0.4000 mg | ORAL_CAPSULE | Freq: Every day | ORAL | Status: DC
Start: 2020-10-24 — End: 2020-10-26
  Administered 2020-10-24 – 2020-10-26 (×3): 0.4 mg via ORAL
  Filled 2020-10-23 (×3): qty 1

## 2020-10-23 MED ORDER — GABAPENTIN 100 MG PO CAPS
100.0000 mg | ORAL_CAPSULE | Freq: Two times a day (BID) | ORAL | Status: DC
  Administered 2020-10-23 – 2020-10-26 (×6): 100 mg via ORAL
  Filled 2020-10-23 (×6): qty 1

## 2020-10-23 MED ORDER — METOPROLOL TARTRATE 5 MG/5ML IV SOLN: 2.0000 mg | INTRAVENOUS | Status: DC | PRN

## 2020-10-23 MED ORDER — LACTATED RINGERS IV SOLN: INTRAVENOUS | Status: DC

## 2020-10-23 MED ORDER — BACITRACIN ZINC 500 UNIT/GM EX OINT
TOPICAL_OINTMENT | CUTANEOUS | Status: DC | PRN
  Administered 2020-10-23: 1 via TOPICAL

## 2020-10-23 MED ORDER — SODIUM CHLORIDE 0.9 % IV SOLN: INTRAVENOUS | Status: DC

## 2020-10-23 SURGICAL SUPPLY — 49 items
BAG COUNTER SPONGE SURGICOUNT (BAG) IMPLANT
BANDAGE ESMARK 6X9 LF (GAUZE/BANDAGES/DRESSINGS) ×1 IMPLANT
BLADE SAW RECIP 87.9 MT (BLADE) ×2 IMPLANT
BNDG COHESIVE 6X5 TAN STRL LF (GAUZE/BANDAGES/DRESSINGS) ×2 IMPLANT
BNDG ELASTIC 4X5.8 VLCR STR LF (GAUZE/BANDAGES/DRESSINGS) ×2 IMPLANT
BNDG ESMARK 6X9 LF (GAUZE/BANDAGES/DRESSINGS) ×2
BNDG GAUZE ELAST 4 BULKY (GAUZE/BANDAGES/DRESSINGS) ×2 IMPLANT
CANISTER SUCT 3000ML PPV (MISCELLANEOUS) ×2 IMPLANT
CLIP VESOCCLUDE MED 6/CT (CLIP) IMPLANT
COVER SURGICAL LIGHT HANDLE (MISCELLANEOUS) ×2 IMPLANT
CUFF TOURN SGL QUICK 24 (TOURNIQUET CUFF) ×2
CUFF TOURN SGL QUICK 34 (TOURNIQUET CUFF)
CUFF TOURN SGL QUICK 42 (TOURNIQUET CUFF) IMPLANT
CUFF TRNQT CYL 24X4X16.5-23 (TOURNIQUET CUFF) ×1 IMPLANT
CUFF TRNQT CYL 34X4.125X (TOURNIQUET CUFF) IMPLANT
DRAIN CHANNEL 19F RND (DRAIN) IMPLANT
DRAPE HALF SHEET 40X57 (DRAPES) IMPLANT
DRAPE ORTHO SPLIT 77X108 STRL (DRAPES)
DRAPE SURG ORHT 6 SPLT 77X108 (DRAPES) IMPLANT
DRAPE U-SHAPE 47X51 STRL (DRAPES) ×2 IMPLANT
DRSG ADAPTIC 3X8 NADH LF (GAUZE/BANDAGES/DRESSINGS) ×2 IMPLANT
ELECT REM PT RETURN 9FT ADLT (ELECTROSURGICAL) ×2
ELECTRODE REM PT RTRN 9FT ADLT (ELECTROSURGICAL) ×1 IMPLANT
EVACUATOR SILICONE 100CC (DRAIN) IMPLANT
GAUZE SPONGE 4X4 12PLY STRL (GAUZE/BANDAGES/DRESSINGS) ×2 IMPLANT
GLOVE SRG 8 PF TXTR STRL LF DI (GLOVE) ×1 IMPLANT
GLOVE SURG ENC MOIS LTX SZ7.5 (GLOVE) ×2 IMPLANT
GLOVE SURG UNDER POLY LF SZ8 (GLOVE) ×2
GOWN STRL REUS W/ TWL LRG LVL3 (GOWN DISPOSABLE) ×3 IMPLANT
GOWN STRL REUS W/TWL LRG LVL3 (GOWN DISPOSABLE) ×6
KIT BASIN OR (CUSTOM PROCEDURE TRAY) ×2 IMPLANT
KIT TURNOVER KIT B (KITS) ×2 IMPLANT
NS IRRIG 1000ML POUR BTL (IV SOLUTION) ×2 IMPLANT
PACK GENERAL/GYN (CUSTOM PROCEDURE TRAY) IMPLANT
PAD ARMBOARD 7.5X6 YLW CONV (MISCELLANEOUS) ×4 IMPLANT
RASP HELIOCORDIAL MED (MISCELLANEOUS) IMPLANT
STAPLER VISISTAT (STAPLE) ×4 IMPLANT
STOCKINETTE IMPERVIOUS LG (DRAPES) ×2 IMPLANT
SUT ETHILON 3 0 PS 1 (SUTURE) IMPLANT
SUT SILK 0 TIES 10X30 (SUTURE) ×2 IMPLANT
SUT SILK 2 0 (SUTURE) ×2
SUT SILK 2 0 SH CR/8 (SUTURE) ×2 IMPLANT
SUT SILK 2-0 18XBRD TIE 12 (SUTURE) ×1 IMPLANT
SUT SILK 3 0 (SUTURE) ×2
SUT SILK 3-0 18XBRD TIE 12 (SUTURE) ×1 IMPLANT
SUT VIC AB 2-0 CT1 18 (SUTURE) ×4 IMPLANT
TOWEL GREEN STERILE (TOWEL DISPOSABLE) ×4 IMPLANT
UNDERPAD 30X36 HEAVY ABSORB (UNDERPADS AND DIAPERS) ×2 IMPLANT
WATER STERILE IRR 1000ML POUR (IV SOLUTION) ×2 IMPLANT

## 2020-10-23 NOTE — Anesthesia Procedure Notes (Signed)
Anesthesia Regional Block: Popliteal block   Pre-Anesthetic Checklist: , timeout performed,  Correct Patient, Correct Site, Correct Laterality,  Correct Procedure, Correct Position, site marked,  Risks and benefits discussed,  Surgical consent,  Pre-op evaluation,  At surgeon's request and post-op pain management  Laterality: Left  Prep: chloraprep       Needles:  Injection technique: Single-shot  Needle Type: Echogenic Stimulator Needle          Additional Needles:   Procedures:,,,, ultrasound used (permanent image in chart),,    Narrative:  Start time: 10/23/2020 12:16 PM End time: 10/23/2020 12:26 PM Injection made incrementally with aspirations every 5 mL.  Performed by: Personally  Anesthesiologist: Duane Boston, MD  Additional Notes: A functioning IV was confirmed and monitors were applied.  Sterile prep and drape, hand hygiene and sterile gloves were used.  Negative aspiration and test dose prior to incremental administration of local anesthetic. The patient tolerated the procedure well.Ultrasound  guidance: relevant anatomy identified, needle position confirmed, local anesthetic spread visualized around nerve(s), vascular puncture avoided.  Image printed for medical record. ACB supplement.

## 2020-10-23 NOTE — Transfer of Care (Signed)
Immediate Anesthesia Transfer of Care Note  Patient: Tony Fisher  Procedure(s) Performed: LEFT BELOW KNEE AMPUTATION (Left: Knee)  Patient Location: PACU  Anesthesia Type:General and Regional  Level of Consciousness: awake, drowsy and patient cooperative  Airway & Oxygen Therapy: Patient Spontanous Breathing and Patient connected to face mask oxygen  Post-op Assessment: Report given to RN, Post -op Vital signs reviewed and stable and Patient moving all extremities X 4  Post vital signs: Reviewed and stable  Last Vitals:  Vitals Value Taken Time  BP    Temp    Pulse 89 10/23/20 1508  Resp 19 10/23/20 1508  SpO2 88 % 10/23/20 1508  Vitals shown include unvalidated device data.  Last Pain:  Vitals:   10/23/20 1137  TempSrc:   PainSc: 0-No pain         Complications: No notable events documented.

## 2020-10-23 NOTE — Interval H&P Note (Signed)
History and Physical Interval Note:  10/23/2020 1:20 PM  Tony Fisher  has presented today for surgery, with the diagnosis of PVD WITH HEEL ULCER.  The various methods of treatment have been discussed with the patient and family. After consideration of risks, benefits and other options for treatment, the patient has consented to  Procedure(s): LEFT BELOW KNEE AMPUTATION (Left) as a surgical intervention.  The patient's history has been reviewed, patient examined, no change in status, stable for surgery.  I have reviewed the patient's chart and labs.  Questions were answered to the patient's satisfaction.     Deitra Mayo

## 2020-10-23 NOTE — Op Note (Signed)
    NAME: Tony Fisher    MRN: [id number] DOB: [birth year]    DATE OF OPERATION: 10/23/2020  PREOP DIAGNOSIS:    Nonhealing left foot wound  POSTOP DIAGNOSIS:    Same  PROCEDURE:    Left below the knee amputation  SURGEON: Judeth Cornfield. Scot Dock, MD  ASSIST: Arlee Muslim, PA  ANESTHESIA: General  EBL: 100 cc  INDICATIONS:    Tony Fisher is a 50 y.o. male who presented with a long history of nonhealing wound of the left foot.  He has a right below the knee amputation.  He wished to pursue left below the knee amputation.  FINDINGS:   The muscle had good perfusion.  Contraction was marginal.  TECHNIQUE:   The patient was taken to the operating room and received a general anesthetic.  The left leg was prepped and draped in the usual sterile fashion.  The circumference of the limb was measured 10 cm distal to the tibial tuberosity and two thirds of this distance was used to mark the anterior skin flap.  A long posterior flap of equal length was marked.  The leg was exsanguinated with an Esmarch bandage and the tourniquet inflated to 300 mmHg.  Under tourniquet control the dissection was carried down through the skin subcutaneous tissue fascia and muscle to the tibia and fibula which were dissected free circumferentially.  The tourniquet was not fully effective and the arteries were clamped and divided.  The leg was then removed.  Additional hemostasis was obtained using 2-0 silk ligatures.  The tourniquet was then released.  Additional hemostasis was obtained.  I then shorten the tibia and beveled the anterior aspect of the tibia with a saw.  The fibula was also shortened.  Hemostasis was obtained in the wound.  The edges of the bone were rasped.  The wound was irrigated with copious amounts of saline.  The fascial layer was then closed with interrupted 2-0 Vicryl's.  The skin was closed with staples.  Sterile dressing was applied.  The patient tolerated the procedure well was  transferred to recovery room in stable condition.  All needle and sponge counts were correct.  Given the complexity of the case a first assistant was necessary in order to expedient the procedure and safely perform the technical aspects of the operation.  Deitra Mayo, MD, FACS [facility]  DATE OF DICTATION:   10/23/2020

## 2020-10-23 NOTE — Anesthesia Procedure Notes (Signed)
Procedure Name: LMA Insertion Date/Time: 10/23/2020 1:44 PM Performed by: Annamary Carolin, CRNA Pre-anesthesia Checklist: Patient identified, Emergency Drugs available, Suction available and Patient being monitored Patient Re-evaluated:Patient Re-evaluated prior to induction Oxygen Delivery Method: Circle System Utilized Preoxygenation: Pre-oxygenation with 100% oxygen Induction Type: IV induction Ventilation: Mask ventilation without difficulty LMA: LMA inserted LMA Size: 4.0 Number of attempts: 1 Airway Equipment and Method: Bite block Placement Confirmation: positive ETCO2 Tube secured with: Tape Dental Injury: Teeth and Oropharynx as per pre-operative assessment  Comments: With ease

## 2020-10-23 NOTE — Anesthesia Postprocedure Evaluation (Signed)
Anesthesia Post Note  Patient: Tony Fisher  Procedure(s) Performed: LEFT BELOW KNEE AMPUTATION (Left: Knee)     Patient location during evaluation: PACU Anesthesia Type: General Level of consciousness: sedated Pain management: pain level controlled Vital Signs Assessment: post-procedure vital signs reviewed and stable Respiratory status: spontaneous breathing and respiratory function stable Cardiovascular status: stable Postop Assessment: no apparent nausea or vomiting Anesthetic complications: no   No notable events documented.  Last Vitals:  Vitals:   10/23/20 1530 10/23/20 1552  BP: (!) 149/82 104/64  Pulse: 89 90  Resp: 17 17  Temp:    SpO2: 99% 98%    Last Pain:  Vitals:   10/23/20 1552  TempSrc:   PainSc: 0-No pain                 [name]

## 2020-10-23 NOTE — Anesthesia Preprocedure Evaluation (Addendum)
Anesthesia Evaluation  Patient identified by MRN, date of birth, ID band Patient awake    Reviewed: Allergy & Precautions, H&P , NPO status , Patient's Chart, lab work & pertinent test results  History of Anesthesia Complications (+) PONV and history of anesthetic complications  Airway Mallampati: I  TM Distance: >3 FB Neck ROM: Full    Dental  (+) Poor Dentition, Missing, Chipped, Dental Advisory Given   Pulmonary sleep apnea ,    Pulmonary exam normal        Cardiovascular + Peripheral Vascular Disease  Normal cardiovascular exam     Neuro/Psych PSYCHIATRIC DISORDERS Depression negative neurological ROS     GI/Hepatic GERD  Medicated,  Endo/Other  diabetes, Type 1, Insulin Dependent  Renal/GU ESRFRenal disease     Musculoskeletal   Abdominal   Peds  Hematology  (+) Blood dyscrasia, anemia ,   Anesthesia Other Findings   Reproductive/Obstetrics                            Anesthesia Physical  Anesthesia Plan  ASA: 3  Anesthesia Plan: General   Post-op Pain Management:  Regional for Post-op pain   Induction: Intravenous  PONV Risk Score and Plan: 4 or greater and Ondansetron, Treatment may vary due to age or medical condition, Midazolam and Propofol infusion  Airway Management Planned: LMA  Additional Equipment: None  Intra-op Plan:   Post-operative Plan: Extubation in OR  Informed Consent: I have reviewed the patients History and Physical, chart, labs and discussed the procedure including the risks, benefits and alternatives for the proposed anesthesia with the patient or authorized representative who has indicated his/her understanding and acceptance.     Dental advisory given  Plan Discussed with: CRNA, Anesthesiologist and Surgeon  Anesthesia Plan Comments:        Anesthesia Quick Evaluation

## 2020-10-24 ENCOUNTER — Encounter (HOSPITAL_COMMUNITY): Payer: Self-pay | Admitting: Vascular Surgery

## 2020-10-24 LAB — BASIC METABOLIC PANEL
Anion gap: 6 (ref 5–15)
BUN: 36 mg/dL — ABNORMAL HIGH (ref 6–20)
CO2: 26 mmol/L (ref 22–32)
Calcium: 8.6 mg/dL — ABNORMAL LOW (ref 8.9–10.3)
Chloride: 106 mmol/L (ref 98–111)
Creatinine, Ser: 2.29 mg/dL — ABNORMAL HIGH (ref 0.61–1.24)
GFR, Estimated: 34 mL/min — ABNORMAL LOW (ref >60)
Glucose, Bld: 194 mg/dL — ABNORMAL HIGH (ref 70–99)
Potassium: 4.9 mmol/L (ref 3.5–5.1)
Sodium: 138 mmol/L (ref 135–145)

## 2020-10-24 LAB — CBC
HCT: 30.5 % — ABNORMAL LOW (ref 39.0–52.0)
Hemoglobin: 9.8 g/dL — ABNORMAL LOW (ref 13.0–17.0)
MCH: 28.6 pg (ref 26.0–34.0)
MCHC: 32.1 g/dL (ref 30.0–36.0)
MCV: 88.9 fL (ref 80.0–100.0)
Platelets: 196 10*3/uL (ref 150–400)
RBC: 3.43 MIL/uL — ABNORMAL LOW (ref 4.22–5.81)
RDW: 13.9 % (ref 11.5–15.5)
WBC: 9.6 10*3/uL (ref 4.0–10.5)
nRBC: 0 % (ref 0.0–0.2)

## 2020-10-24 LAB — GLUCOSE, CAPILLARY
Glucose-Capillary: 141 mg/dL — ABNORMAL HIGH (ref 70–99)
Glucose-Capillary: 154 mg/dL — ABNORMAL HIGH (ref 70–99)
Glucose-Capillary: 137 mg/dL — ABNORMAL HIGH (ref 70–99)
Glucose-Capillary: 118 mg/dL — ABNORMAL HIGH (ref 70–99)

## 2020-10-24 NOTE — TOC Initial Note (Signed)
Transition of Care [facility]) - Initial/Assessment Note    Patient Details  Name: Tony Fisher MRN: [id number] Date of Birth: 01-Sep-1970  [facility]) CM/SW Contact:    Carles Collet, RN Phone Number: 10/24/2020, 1:30 PM  Clinical Narrative:              Notified Amy w [facility] [place] order. Referral made through outpatient office to [facility] agency prior to admission      Expected Discharge Plan: [place]     Patient Goals and CMS Choice     Choice offered to / list presented to : NA (referral made by office prior to admission)  Expected Discharge Plan and Services Expected Discharge Plan: [place]: PT [place]: [place] Date [place]: 10/24/20 Time [place]: [id number] Representative spoke with at [place]: Amy  Prior Living Arrangements/Services                       Activities of Daily Living      Permission Sought/Granted                  Emotional Assessment              Admission diagnosis:  S/P BKA (below knee amputation) ([place]) [Z89.519] Patient Active Problem List   Diagnosis Date Noted   S/P BKA (below knee amputation) ([place]) 10/23/2020   Non-healing ulcer ([place]) 04/24/2019   Critical lower limb ischemia ([place]) 07/11/2017   PCP:  Edrick Oh, MD Pharmacy:   [id number]: [phone]: [phone]    Social Determinants of Health (SDOH) Interventions    Readmission Risk Interventions No flowsheet data found.

## 2020-10-24 NOTE — Progress Notes (Signed)
  Progress Note    10/24/2020 9:45 AM 1 Day Post-Op  Subjective: Minimal pain left leg  Vitals:   10/24/20 0500 10/24/20 0800  BP: (!) 147/83 140/78  Pulse: 90 92  Resp: 14 15  Temp:  99 F (37.2 C)  SpO2: 97% 96%    Physical Exam: Awake alert oriented Left below-knee amputation dressing clean dry intact Well-healed right below-knee amputation  CBC    Component Value Date/Time   WBC 9.6 10/24/2020 0028   RBC 3.43 (L) 10/24/2020 0028   HGB 9.8 (L) 10/24/2020 0028   HCT 30.5 (L) 10/24/2020 0028   PLT 196 10/24/2020 0028   MCV 88.9 10/24/2020 0028   MCH 28.6 10/24/2020 0028   MCHC 32.1 10/24/2020 0028   RDW 13.9 10/24/2020 0028   LYMPHSABS 0.7 11/24/2006 0435   MONOABS 0.4 11/24/2006 0435   EOSABS 0.2 11/24/2006 0435   BASOSABS 0.1 11/24/2006 0435    BMET    Component Value Date/Time   NA 138 10/24/2020 0028   K 4.9 10/24/2020 0028   CL 106 10/24/2020 0028   CO2 26 10/24/2020 0028   GLUCOSE 194 (H) 10/24/2020 0028   BUN 36 (H) 10/24/2020 0028   CREATININE 2.29 (H) 10/24/2020 0028   CALCIUM 8.6 (L) 10/24/2020 0028   GFRNONAA 34 (L) 10/24/2020 0028   GFRAA  11/24/2006 0435    >60        The eGFR has been calculated using the MDRD equation. This calculation has not been validated in all clinical    INR    Component Value Date/Time   INR 1.1 10/20/2020 1430     Intake/Output Summary (Last 24 hours) at 10/24/2020 0945 Last data filed at 10/24/2020 0800 Gross per 24 hour  Intake 1449.04 ml  Output 200 ml  Net 1249.04 ml     Assessment:  50 y.o. male is s/p left below-knee amputation with previous right below-knee amputation  Plan: Creatinine today upper limit of normal we will continue IV fluid hydration  Limb guard ordered from [place]  Physical therapy out of bed  Plan will be for home health with PT  Subcutaneous heparin  Magie Ciampa C. Donzetta Matters, MD [facility] [place] Office: [phone] Pager:  [phone]  10/24/2020 9:45 AM

## 2020-10-24 NOTE — Evaluation (Signed)
Physical Therapy Evaluation Patient Details Name: Tony Fisher MRN: [id number] DOB: Jun 18, 1970 Today's Date: 10/24/2020  History of Present Illness  The pt is a 50 yo male presenting s/p L BKA on 9/23 due to non-healing L foot wound. PMH includes: type 1 diabetes, ESFR, depression, PVD, sleep apnea, and prior R BKA.   Clinical Impression  Pt in bed upon arrival of PT, agreeable to evaluation at this time. Prior to admission the pt was mobilizing with use of RW and knee scooter depending on distance, and completing ADLs independently with use of DME. The pt now presents with limitations in functional mobility, dynamic stability, and activity tolerance due to above dx, and will continue to benefit from skilled PT to address these deficits. The pt was able to demo good ability to complete bed mobility, sit-stand transfers from high and low surfaces, and short bout of ambulation with minG for safety but little physical assistance. He does require BUE support for balance, but is able to manage RW well to maintain balance in small spaces and with frequent changes in direction. Is safe to return home with family, will benefit from skilled PT acutely to progress ambulation distance and complete stair training prior to d/c.         Recommendations for follow up therapy are one component of a multi-disciplinary discharge planning process, led by the attending physician.  Recommendations may be updated based on patient status, additional functional criteria and insurance authorization.  Follow Up Recommendations No PT follow up;Supervision for mobility/OOB    Equipment Recommendations  None recommended by PT    Recommendations for Other Services       Precautions / Restrictions Precautions Precautions: Fall Precaution Comments: chronic R BKA with prosthesis Restrictions Weight Bearing Restrictions: Yes LLE Weight Bearing: Non weight bearing      Mobility  Bed Mobility Overal bed mobility:  Modified Independent             General bed mobility comments: slightly increased time and intermittent use of bed rails, vomiting with initial transition to sitting    Transfers Overall transfer level: Needs assistance Equipment used: Rolling walker (2 wheeled) Transfers: Sit to/from Stand Sit to Stand: Min guard;From elevated surface         General transfer comment: pt with elevated bed at home, minG to rise from bed elevated to height with hips above knees  Ambulation/Gait Ambulation/Gait assistance: Min guard   Assistive device: Rolling walker (2 wheeled) Gait Pattern/deviations: Step-to pattern Gait velocity: decreased Gait velocity interpretation: <1.31 ft/sec, indicative of household ambulator General Gait Details: hop-to pattern with RLE. good stability with RLE prosthetic use. slow but steady, good safety awareness     Balance Overall balance assessment: Needs assistance Sitting-balance support: No upper extremity supported Sitting balance-Leahy Scale: Good     Standing balance support: Bilateral upper extremity supported Standing balance-Leahy Scale: Poor Standing balance comment: dependent on BUE support                             Pertinent Vitals/Pain Pain Assessment: 0-10 Pain Score: 1  Pain Location: incision Pain Descriptors / Indicators: Sore Pain Intervention(s): Limited activity within patient's tolerance;Monitored during session;Repositioned    Home Living Family/patient expects to be discharged to:: Private residence Living Arrangements: Spouse/significant other Available Help at Discharge: Family;Available 24 hours/day Type of Home: Apartment Home Access: Stairs to enter Entrance Stairs-Rails: Right;Left Entrance Stairs-Number of Steps: 13 and then 6, both  sets have both rails, cannot reach both Home Layout: One level Home Equipment: Walker - 2 wheels;Shower seat;Grab bars - tub/shower      Prior Function Level of  Independence: Independent         Comments: independent with no AD reccently, does use shower seat     Hand Dominance   Dominant Hand: Right    Extremity/Trunk Assessment   Upper Extremity Assessment Upper Extremity Assessment: Overall WFL for tasks assessed    Lower Extremity Assessment Lower Extremity Assessment: RLE deficits/detail;LLE deficits/detail RLE Deficits / Details: chronic BKA, prosthetic present. pt with good ROM and strength LLE Deficits / Details: new BKA, limited due to NWB, pt with good ROM LLE: Unable to fully assess due to immobilization LLE Sensation: WNL LLE Coordination: WNL    Cervical / Trunk Assessment Cervical / Trunk Assessment: Normal  Communication   Communication: No difficulties  Cognition Arousal/Alertness: Awake/alert Behavior During Therapy: WFL for tasks assessed/performed Overall Cognitive Status: Within Functional Limits for tasks assessed                                        General Comments General comments (skin integrity, edema, etc.): HR to 124 with toileting and 133 with vomiting, RN provided zofran during session    Exercises     Assessment/Plan    PT Assessment Patient needs continued PT services  PT Problem List Decreased range of motion;Decreased activity tolerance;Decreased balance;Decreased mobility       PT Treatment Interventions DME instruction;Gait training;Stair training;Functional mobility training;Therapeutic activities;Therapeutic exercise;Balance training;Patient/family education    PT Goals (Current goals can be found in the Care Plan section)  Acute Rehab PT Goals Patient Stated Goal: return home PT Goal Formulation: With patient Time For Goal Achievement: 11/07/20 Potential to Achieve Goals: Good    Frequency Min 3X/week    AM-PAC PT "6 Clicks" Mobility  Outcome Measure Help needed turning from your back to your side while in a flat bed without using bedrails?: None Help  needed moving from lying on your back to sitting on the side of a flat bed without using bedrails?: None Help needed moving to and from a bed to a chair (including a wheelchair)?: A Little Help needed standing up from a chair using your arms (e.g., wheelchair or bedside chair)?: A Little Help needed to walk in hospital room?: A Little Help needed climbing 3-5 steps with a railing? : A Little 6 Click Score: 20    End of Session Equipment Utilized During Treatment: Gait belt Activity Tolerance: Patient tolerated treatment well   Nurse Communication: Mobility status PT Visit Diagnosis: Other abnormalities of gait and mobility (R26.89);Muscle weakness (generalized) (M62.81)    Time: [id number] PT Time Calculation (min) (ACUTE ONLY): 52 min   Charges:   PT Evaluation $PT Eval Moderate Complexity: 1 Mod PT Treatments $Gait Training: 8-22 mins $Therapeutic Activity: 23-37 mins        West Carbo, PT, DPT   Acute Rehabilitation Department Pager #: [phone]  Sandra Cockayne 10/24/2020, 5:25 PM

## 2020-10-24 NOTE — Progress Notes (Signed)
PT Cancellation Note  Patient Details Name: JA DELLAQUILA MRN: [id number] DOB: [birth year]   Cancelled Treatment:    Reason Eval/Treat Not Completed: Other (comment) Pt awaiting return of wife with R prosthesis and requesting to wait on PT evaluation until she returns with his leg. Will check back later in the day for evaluation.    West Carbo, PT, DPT   Acute Rehabilitation Department Pager #: [phone]   Sandra Cockayne 10/24/2020, 8:14 AM

## 2020-10-24 NOTE — Progress Notes (Signed)
Orthopedic Tech Progress Note Patient Details:  Tony Fisher Oct 24, 1970 [id number] Ampushield BK (Vive Protocol BK) has been ordered from [facility] for patient's LLE.  Patient ID: Tony Fisher, male   DOB: 1970/07/23, 50 y.o.   MRN: [id number]  [name] 10/24/2020, 8:54 AM

## 2020-10-25 LAB — GLUCOSE, CAPILLARY
Glucose-Capillary: 254 mg/dL — ABNORMAL HIGH (ref 70–99)
Glucose-Capillary: 170 mg/dL — ABNORMAL HIGH (ref 70–99)
Glucose-Capillary: 191 mg/dL — ABNORMAL HIGH (ref 70–99)
Glucose-Capillary: 123 mg/dL — ABNORMAL HIGH (ref 70–99)

## 2020-10-25 LAB — BASIC METABOLIC PANEL
Anion gap: 6 (ref 5–15)
BUN: 28 mg/dL — ABNORMAL HIGH (ref 6–20)
CO2: 25 mmol/L (ref 22–32)
Calcium: 8.7 mg/dL — ABNORMAL LOW (ref 8.9–10.3)
Chloride: 108 mmol/L (ref 98–111)
Creatinine, Ser: 2.04 mg/dL — ABNORMAL HIGH (ref 0.61–1.24)
GFR, Estimated: 39 mL/min — ABNORMAL LOW (ref >60)
Glucose, Bld: 49 mg/dL — ABNORMAL LOW (ref 70–99)
Potassium: 4.8 mmol/L (ref 3.5–5.1)
Sodium: 139 mmol/L (ref 135–145)

## 2020-10-25 LAB — CBC
HCT: 29 % — ABNORMAL LOW (ref 39.0–52.0)
Hemoglobin: 9.2 g/dL — ABNORMAL LOW (ref 13.0–17.0)
MCH: 28.7 pg (ref 26.0–34.0)
MCHC: 31.7 g/dL (ref 30.0–36.0)
MCV: 90.3 fL (ref 80.0–100.0)
Platelets: 173 10*3/uL (ref 150–400)
RBC: 3.21 MIL/uL — ABNORMAL LOW (ref 4.22–5.81)
RDW: 13.9 % (ref 11.5–15.5)
WBC: 8.3 10*3/uL (ref 4.0–10.5)
nRBC: 0 % (ref 0.0–0.2)

## 2020-10-25 NOTE — Progress Notes (Signed)
  Progress Note    10/25/2020 10:04 AM 2 Days Post-Op  Subjective: No complaints today  Vitals:   10/25/20 0300 10/25/20 0700  BP: 122/69 (!) 158/85  Pulse: 94 81  Resp: 19 18  Temp: 98.1 F (36.7 C) 98.1 F (36.7 C)  SpO2: 98% 98%    Physical Exam: Awake alert oriented On the respirations Left below-knee amputation site healing well with staples  CBC    Component Value Date/Time   WBC 8.3 10/25/2020 0034   RBC 3.21 (L) 10/25/2020 0034   HGB 9.2 (L) 10/25/2020 0034   HCT 29.0 (L) 10/25/2020 0034   PLT 173 10/25/2020 0034   MCV 90.3 10/25/2020 0034   MCH 28.7 10/25/2020 0034   MCHC 31.7 10/25/2020 0034   RDW 13.9 10/25/2020 0034   LYMPHSABS 0.7 11/24/2006 0435   MONOABS 0.4 11/24/2006 0435   EOSABS 0.2 11/24/2006 0435   BASOSABS 0.1 11/24/2006 0435    BMET    Component Value Date/Time   NA 139 10/25/2020 0034   K 4.8 10/25/2020 0034   CL 108 10/25/2020 0034   CO2 25 10/25/2020 0034   GLUCOSE 49 (L) 10/25/2020 0034   BUN 28 (H) 10/25/2020 0034   CREATININE 2.04 (H) 10/25/2020 0034   CALCIUM 8.7 (L) 10/25/2020 0034   GFRNONAA 39 (L) 10/25/2020 0034   GFRAA  11/24/2006 0435    >60        The eGFR has been calculated using the MDRD equation. This calculation has not been validated in all clinical    INR    Component Value Date/Time   INR 1.1 10/20/2020 1430     Intake/Output Summary (Last 24 hours) at 10/25/2020 1004 Last data filed at 10/25/2020 0800 Gross per 24 hour  Intake 720 ml  Output --  Net 720 ml     Assessment:  50 y.o. male is s/p left below-knee amputation  Plan: Dressing changed today at bedside is healing well, limb shield reapplied  On home dose CellCept and Prograf  Dispo will be home with home health PT  Subcutaneous heparin    Giacomo Valone C. Donzetta Matters, MD [facility] [place] Office: [phone] Pager: [phone]  10/25/2020 10:04 AM

## 2020-10-26 LAB — GLUCOSE, CAPILLARY: Glucose-Capillary: 157 mg/dL — ABNORMAL HIGH (ref 70–99)

## 2020-10-26 MED ORDER — HYDROCODONE-ACETAMINOPHEN 5-325 MG PO TABS: 1.0000 | ORAL_TABLET | Freq: Four times a day (QID) | ORAL | 0 refills | Status: DC | PRN

## 2020-10-26 NOTE — Progress Notes (Signed)
Physical Therapy Treatment Patient Details Name: TAHSIN CARSE MRN: [id number] DOB: [birth year] Today's Date: 10/26/2020   History of Present Illness The pt is a 50 yo male presenting s/p L BKA on 9/23 due to non-healing L foot wound. PMH includes: type 1 diabetes, ESFR, depression, PVD, sleep apnea, and prior R BKA.    PT Comments    Patient progressing well with mobility; reports he got up and got dressed this AM and ready to d/c home. Tolerated bed mobility, transfers and short distance ambulation with use of RW. Good safety awareness. Reviewed there ex of LEs and UEs to promote strengthening and ROM of left residual limb. Discussed safe stair negotiation, reports he feels comfortable doing these at home and explained technique (as used with prior BKA). Suggested scooting up stairs on bottom is need be and having wife present initially to assist. Emphasized importance of hip/knee extension and strengthening of UEs and glutes to prepare LE for prosthesis. Reports having theraband at home and demonstrated standing there ex. Pt does not require HHPT at this time however expressed possibly needing follow up down the road. Will continue to follow and progress as tolerated.   Recommendations for follow up therapy are one component of a multi-disciplinary discharge planning process, led by the attending physician.  Recommendations may be updated based on patient status, additional functional criteria and insurance authorization.  Follow Up Recommendations  No PT follow up;Supervision for mobility/OOB     Equipment Recommendations  None recommended by PT    Recommendations for Other Services       Precautions / Restrictions Precautions Precautions: Fall Precaution Comments: chronic R BKA with prosthesis Required Braces or Orthoses: Other Brace Other Brace: limb guard Restrictions Weight Bearing Restrictions: Yes LLE Weight Bearing: Non weight bearing     Mobility  Bed  Mobility Overal bed mobility: Modified Independent             General bed mobility comments: No assist needed.    Transfers Overall transfer level: Needs assistance Equipment used: Rolling walker (2 wheeled) Transfers: Sit to/from Stand Sit to Stand: Min guard         General transfer comment: Min guard for safety from low bed height, cues for hand placement as pt pulling up on RW to stand. Stood from [employer], from chair x1.  Ambulation/Gait Ambulation/Gait assistance: Min guard Gait Distance (Feet): 12 Feet (x2) Assistive device: Rolling walker (2 wheeled) Gait Pattern/deviations: Step-to pattern Gait velocity: decreased Gait velocity interpretation: <1.31 ft/sec, indicative of household ambulator General Gait Details: hop-to pattern with RLE. good stability with RLE prosthetic use. slow but steady, good safety awareness.   Stairs             Wheelchair Mobility    Modified Rankin (Stroke Patients Only)       Balance Overall balance assessment: Needs assistance Sitting-balance support: No upper extremity supported Sitting balance-Leahy Scale: Good Sitting balance - Comments: ABle to donn prosthesis without difficulty,   Standing balance support: During functional activity Standing balance-Leahy Scale: Poor Standing balance comment: dependent on BUE support                            Cognition Arousal/Alertness: Awake/alert Behavior During Therapy: WFL for tasks assessed/performed Overall Cognitive Status: Within Functional Limits for tasks assessed  Exercises Amputee Exercises Hip Extension: AROM;Left;Standing Straight Leg Raises: AROM;Left;Supine (x3) Chair Push Up: Strengthening;Both;5 reps;Seated    General Comments General comments (skin integrity, edema, etc.): VSS on RA. Reports some mild phantom limb discomfort.      Pertinent Vitals/Pain Pain Assessment: Faces Faces  Pain Scale: Hurts a little bit Pain Location: calf Pain Descriptors / Indicators: Discomfort;Dull Pain Intervention(s): Monitored during session;Repositioned    Home Living                      Prior Function            PT Goals (current goals can now be found in the care plan section) Progress towards PT goals: Progressing toward goals    Frequency    Min 3X/week      PT Plan Current plan remains appropriate    Co-evaluation              AM-PAC PT "6 Clicks" Mobility   Outcome Measure  Help needed turning from your back to your side while in a flat bed without using bedrails?: None Help needed moving from lying on your back to sitting on the side of a flat bed without using bedrails?: None Help needed moving to and from a bed to a chair (including a wheelchair)?: A Little Help needed standing up from a chair using your arms (e.g., wheelchair or bedside chair)?: A Little Help needed to walk in hospital room?: A Little Help needed climbing 3-5 steps with a railing? : A Little 6 Click Score: 20    End of Session   Activity Tolerance: Patient tolerated treatment well Patient left: in bed;with call bell/phone within reach Nurse Communication: Mobility status PT Visit Diagnosis: Other abnormalities of gait and mobility (R26.89);Muscle weakness (generalized) (M62.81)     Time: [id number] PT Time Calculation (min) (ACUTE ONLY): 22 min  Charges:  $Therapeutic Activity: 8-22 mins                     Marisa Severin, PT, DPT Acute Rehabilitation Services Pager [phone] Office [phone]      Marguarite Arbour A Sabra Heck 10/26/2020, 9:06 AM

## 2020-10-26 NOTE — TOC Transition Note (Signed)
Transition of Care [facility]) - CM/SW Discharge Note   Patient Details  Name: Tony Fisher MRN: [id number] Date of Birth: 10-18-1970  [facility] CM/SW Contact:  Zenon Mayo, RN Phone Number: 10/26/2020, 9:12 AM   Clinical Narrative:    Patient is for dc today, NCM notified Amy with [facility].    Final next level of care: Home w Home Health Services Barriers to Discharge: No Barriers Identified   Patient Goals and CMS Choice Patient states their goals for this hospitalization and ongoing recovery are:: return home   Choice offered to / list presented to : NA (referral made by office prior to admission)  Discharge Placement                       Discharge Plan and Services                  DME Agency: NA       HH Arranged: PT HH Agency: [facility] Date [facility] Agency Contacted: 10/24/20 Time [place]: [id number] Representative spoke with at [place]: Amy  Social Determinants of Health ([place]) Interventions     Readmission Risk Interventions No flowsheet data found.

## 2020-10-26 NOTE — Care Management Important Message (Signed)
Important Message  Patient Details  Name: SIRMICHAEL LORENSON MRN: [id number] Date of Birth: 1970/08/23   Medicare Important Message Given:  Yes  Patient left prior to IM delivery  Will mail IM to patient home address.    Katelyne Galster 10/26/2020, 3:58 PM

## 2020-10-26 NOTE — Progress Notes (Signed)
Discharge instructions given to patient. Patient understands follow up appointments and wound care for BKA. 1 PIV was removed from right wrist without complication. Site clean dry and intact. Patient discharging home with wife.

## 2020-10-26 NOTE — Progress Notes (Addendum)
  Progress Note    10/26/2020 7:12 AM 3 Days Post-Op  Subjective:  no complaints; ready to go home.    Afebrile HR 70's-90's NSR 82'C-003'K systolic 91% RA Vitals:   10/26/20 0432 10/26/20 0507  BP: 90/60 (!) 160/80  Pulse: 82 84  Resp: 15 17  Temp: 97.8 F (36.6 C) 98.2 F (36.8 C)  SpO2: 93% 95%    Physical Exam: General:  no distress; limb guard in place   CBC    Component Value Date/Time   WBC 8.3 10/25/2020 0034   RBC 3.21 (L) 10/25/2020 0034   HGB 9.2 (L) 10/25/2020 0034   HCT 29.0 (L) 10/25/2020 0034   PLT 173 10/25/2020 0034   MCV 90.3 10/25/2020 0034   MCH 28.7 10/25/2020 0034   MCHC 31.7 10/25/2020 0034   RDW 13.9 10/25/2020 0034   LYMPHSABS 0.7 11/24/2006 0435   MONOABS 0.4 11/24/2006 0435   EOSABS 0.2 11/24/2006 0435   BASOSABS 0.1 11/24/2006 0435    BMET    Component Value Date/Time   NA 139 10/25/2020 0034   K 4.8 10/25/2020 0034   CL 108 10/25/2020 0034   CO2 25 10/25/2020 0034   GLUCOSE 49 (L) 10/25/2020 0034   BUN 28 (H) 10/25/2020 0034   CREATININE 2.04 (H) 10/25/2020 0034   CALCIUM 8.7 (L) 10/25/2020 0034   GFRNONAA 39 (L) 10/25/2020 0034   GFRAA  11/24/2006 0435    >60        The eGFR has been calculated using the MDRD equation. This calculation has not been validated in all clinical    INR    Component Value Date/Time   INR 1.1 10/20/2020 1430     Intake/Output Summary (Last 24 hours) at 10/26/2020 7915 Last data filed at 10/25/2020 1755 Gross per 24 hour  Intake 720 ml  Output --  Net 720 ml     Assessment/Plan:  50 y.o. male is s/p left below knee amputation  3 Days Post-Op  -pt doing well this morning.  Up and about to the bathroom.  PT recommending no PT follow up.   -d/c home today-f/u with [facility] in 4 weeks for staple removal.  Pt knows to call if he has any issues before then.   Leontine Locket, PA-C [facility] [phone] 10/26/2020 7:12 AM  I have interviewed the patient and  examined the patient. I agree with the findings by the PA. Wound inspected. Looks good. Agree with plans for D/C today.   Gae Gallop, MD

## 2020-10-26 NOTE — Plan of Care (Signed)
  Problem: Education: Goal: Knowledge of General Education information will improve Description: Including pain rating scale, medication(s)/side effects and non-pharmacologic comfort measures Outcome: Adequate for Discharge   
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
## 2020-10-27 LAB — SURGICAL PATHOLOGY

## 2020-10-28 DIAGNOSIS — D631 Anemia in chronic kidney disease: Secondary | ICD-10-CM | POA: Diagnosis not present

## 2020-10-28 DIAGNOSIS — Z89511 Acquired absence of right leg below knee: Secondary | ICD-10-CM | POA: Diagnosis not present

## 2020-10-28 DIAGNOSIS — N183 Chronic kidney disease, stage 3 unspecified: Secondary | ICD-10-CM | POA: Diagnosis not present

## 2020-10-28 DIAGNOSIS — Z4781 Encounter for orthopedic aftercare following surgical amputation: Secondary | ICD-10-CM | POA: Diagnosis not present

## 2020-10-28 DIAGNOSIS — K219 Gastro-esophageal reflux disease without esophagitis: Secondary | ICD-10-CM | POA: Diagnosis not present

## 2020-10-28 DIAGNOSIS — Z940 Kidney transplant status: Secondary | ICD-10-CM | POA: Diagnosis not present

## 2020-10-28 DIAGNOSIS — I5020 Unspecified systolic (congestive) heart failure: Secondary | ICD-10-CM | POA: Diagnosis not present

## 2020-10-28 DIAGNOSIS — Z7982 Long term (current) use of aspirin: Secondary | ICD-10-CM | POA: Diagnosis not present

## 2020-10-28 DIAGNOSIS — N400 Enlarged prostate without lower urinary tract symptoms: Secondary | ICD-10-CM | POA: Diagnosis not present

## 2020-10-28 DIAGNOSIS — E1122 Type 2 diabetes mellitus with diabetic chronic kidney disease: Secondary | ICD-10-CM | POA: Diagnosis not present

## 2020-10-28 DIAGNOSIS — Z794 Long term (current) use of insulin: Secondary | ICD-10-CM | POA: Diagnosis not present

## 2020-10-28 DIAGNOSIS — Z9483 Pancreas transplant status: Secondary | ICD-10-CM | POA: Diagnosis not present

## 2020-10-28 DIAGNOSIS — F32A Depression, unspecified: Secondary | ICD-10-CM | POA: Diagnosis not present

## 2020-10-28 DIAGNOSIS — E1140 Type 2 diabetes mellitus with diabetic neuropathy, unspecified: Secondary | ICD-10-CM | POA: Diagnosis not present

## 2020-10-28 DIAGNOSIS — I739 Peripheral vascular disease, unspecified: Secondary | ICD-10-CM | POA: Diagnosis not present

## 2020-10-29 DIAGNOSIS — I5020 Unspecified systolic (congestive) heart failure: Secondary | ICD-10-CM | POA: Diagnosis not present

## 2020-10-29 DIAGNOSIS — I739 Peripheral vascular disease, unspecified: Secondary | ICD-10-CM | POA: Diagnosis not present

## 2020-10-29 DIAGNOSIS — D631 Anemia in chronic kidney disease: Secondary | ICD-10-CM | POA: Diagnosis not present

## 2020-10-29 DIAGNOSIS — E1140 Type 2 diabetes mellitus with diabetic neuropathy, unspecified: Secondary | ICD-10-CM | POA: Diagnosis not present

## 2020-10-29 DIAGNOSIS — Z4781 Encounter for orthopedic aftercare following surgical amputation: Secondary | ICD-10-CM | POA: Diagnosis not present

## 2020-10-29 DIAGNOSIS — Z89511 Acquired absence of right leg below knee: Secondary | ICD-10-CM | POA: Diagnosis not present

## 2020-10-30 DIAGNOSIS — D631 Anemia in chronic kidney disease: Secondary | ICD-10-CM | POA: Diagnosis not present

## 2020-10-30 DIAGNOSIS — I739 Peripheral vascular disease, unspecified: Secondary | ICD-10-CM | POA: Diagnosis not present

## 2020-10-30 DIAGNOSIS — Z89511 Acquired absence of right leg below knee: Secondary | ICD-10-CM | POA: Diagnosis not present

## 2020-10-30 DIAGNOSIS — I5020 Unspecified systolic (congestive) heart failure: Secondary | ICD-10-CM | POA: Diagnosis not present

## 2020-10-30 DIAGNOSIS — E1140 Type 2 diabetes mellitus with diabetic neuropathy, unspecified: Secondary | ICD-10-CM | POA: Diagnosis not present

## 2020-10-30 DIAGNOSIS — Z4781 Encounter for orthopedic aftercare following surgical amputation: Secondary | ICD-10-CM | POA: Diagnosis not present

## 2020-11-04 DIAGNOSIS — D631 Anemia in chronic kidney disease: Secondary | ICD-10-CM | POA: Diagnosis not present

## 2020-11-04 DIAGNOSIS — Z4781 Encounter for orthopedic aftercare following surgical amputation: Secondary | ICD-10-CM | POA: Diagnosis not present

## 2020-11-04 DIAGNOSIS — I5020 Unspecified systolic (congestive) heart failure: Secondary | ICD-10-CM | POA: Diagnosis not present

## 2020-11-04 DIAGNOSIS — I739 Peripheral vascular disease, unspecified: Secondary | ICD-10-CM | POA: Diagnosis not present

## 2020-11-04 DIAGNOSIS — Z89511 Acquired absence of right leg below knee: Secondary | ICD-10-CM | POA: Diagnosis not present

## 2020-11-04 DIAGNOSIS — E1140 Type 2 diabetes mellitus with diabetic neuropathy, unspecified: Secondary | ICD-10-CM | POA: Diagnosis not present

## 2020-11-06 DIAGNOSIS — E1140 Type 2 diabetes mellitus with diabetic neuropathy, unspecified: Secondary | ICD-10-CM | POA: Diagnosis not present

## 2020-11-06 DIAGNOSIS — D631 Anemia in chronic kidney disease: Secondary | ICD-10-CM | POA: Diagnosis not present

## 2020-11-06 DIAGNOSIS — Z4781 Encounter for orthopedic aftercare following surgical amputation: Secondary | ICD-10-CM | POA: Diagnosis not present

## 2020-11-06 DIAGNOSIS — I739 Peripheral vascular disease, unspecified: Secondary | ICD-10-CM | POA: Diagnosis not present

## 2020-11-06 DIAGNOSIS — I5020 Unspecified systolic (congestive) heart failure: Secondary | ICD-10-CM | POA: Diagnosis not present

## 2020-11-06 DIAGNOSIS — Z89511 Acquired absence of right leg below knee: Secondary | ICD-10-CM | POA: Diagnosis not present

## 2020-11-10 DIAGNOSIS — N186 End stage renal disease: Secondary | ICD-10-CM | POA: Diagnosis not present

## 2020-11-10 DIAGNOSIS — Z89512 Acquired absence of left leg below knee: Secondary | ICD-10-CM | POA: Diagnosis not present

## 2020-11-10 DIAGNOSIS — E1042 Type 1 diabetes mellitus with diabetic polyneuropathy: Secondary | ICD-10-CM | POA: Diagnosis not present

## 2020-11-10 DIAGNOSIS — Z940 Kidney transplant status: Secondary | ICD-10-CM | POA: Diagnosis not present

## 2020-11-10 DIAGNOSIS — E1021 Type 1 diabetes mellitus with diabetic nephropathy: Secondary | ICD-10-CM | POA: Diagnosis not present

## 2020-11-10 DIAGNOSIS — E1022 Type 1 diabetes mellitus with diabetic chronic kidney disease: Secondary | ICD-10-CM | POA: Diagnosis not present

## 2020-11-11 DIAGNOSIS — Z89511 Acquired absence of right leg below knee: Secondary | ICD-10-CM | POA: Diagnosis not present

## 2020-11-11 DIAGNOSIS — D631 Anemia in chronic kidney disease: Secondary | ICD-10-CM | POA: Diagnosis not present

## 2020-11-11 DIAGNOSIS — Z4781 Encounter for orthopedic aftercare following surgical amputation: Secondary | ICD-10-CM | POA: Diagnosis not present

## 2020-11-11 DIAGNOSIS — E1140 Type 2 diabetes mellitus with diabetic neuropathy, unspecified: Secondary | ICD-10-CM | POA: Diagnosis not present

## 2020-11-11 DIAGNOSIS — I5020 Unspecified systolic (congestive) heart failure: Secondary | ICD-10-CM | POA: Diagnosis not present

## 2020-11-11 DIAGNOSIS — I739 Peripheral vascular disease, unspecified: Secondary | ICD-10-CM | POA: Diagnosis not present

## 2020-11-13 DIAGNOSIS — Z89511 Acquired absence of right leg below knee: Secondary | ICD-10-CM | POA: Diagnosis not present

## 2020-11-13 DIAGNOSIS — E1140 Type 2 diabetes mellitus with diabetic neuropathy, unspecified: Secondary | ICD-10-CM | POA: Diagnosis not present

## 2020-11-13 DIAGNOSIS — D631 Anemia in chronic kidney disease: Secondary | ICD-10-CM | POA: Diagnosis not present

## 2020-11-13 DIAGNOSIS — I5020 Unspecified systolic (congestive) heart failure: Secondary | ICD-10-CM | POA: Diagnosis not present

## 2020-11-13 DIAGNOSIS — Z4781 Encounter for orthopedic aftercare following surgical amputation: Secondary | ICD-10-CM | POA: Diagnosis not present

## 2020-11-13 DIAGNOSIS — I739 Peripheral vascular disease, unspecified: Secondary | ICD-10-CM | POA: Diagnosis not present

## 2020-11-17 DIAGNOSIS — Z89511 Acquired absence of right leg below knee: Secondary | ICD-10-CM | POA: Diagnosis not present

## 2020-11-17 DIAGNOSIS — I5020 Unspecified systolic (congestive) heart failure: Secondary | ICD-10-CM | POA: Diagnosis not present

## 2020-11-17 DIAGNOSIS — E1140 Type 2 diabetes mellitus with diabetic neuropathy, unspecified: Secondary | ICD-10-CM | POA: Diagnosis not present

## 2020-11-17 DIAGNOSIS — D631 Anemia in chronic kidney disease: Secondary | ICD-10-CM | POA: Diagnosis not present

## 2020-11-17 DIAGNOSIS — Z4781 Encounter for orthopedic aftercare following surgical amputation: Secondary | ICD-10-CM | POA: Diagnosis not present

## 2020-11-17 DIAGNOSIS — I739 Peripheral vascular disease, unspecified: Secondary | ICD-10-CM | POA: Diagnosis not present

## 2020-11-20 DIAGNOSIS — Z4781 Encounter for orthopedic aftercare following surgical amputation: Secondary | ICD-10-CM | POA: Diagnosis not present

## 2020-11-20 DIAGNOSIS — Z89511 Acquired absence of right leg below knee: Secondary | ICD-10-CM | POA: Diagnosis not present

## 2020-11-20 DIAGNOSIS — I5020 Unspecified systolic (congestive) heart failure: Secondary | ICD-10-CM | POA: Diagnosis not present

## 2020-11-20 DIAGNOSIS — I739 Peripheral vascular disease, unspecified: Secondary | ICD-10-CM | POA: Diagnosis not present

## 2020-11-20 DIAGNOSIS — E1140 Type 2 diabetes mellitus with diabetic neuropathy, unspecified: Secondary | ICD-10-CM | POA: Diagnosis not present

## 2020-11-20 DIAGNOSIS — D631 Anemia in chronic kidney disease: Secondary | ICD-10-CM | POA: Diagnosis not present

## 2020-11-26 ENCOUNTER — Encounter: Payer: Self-pay | Admitting: Vascular Surgery

## 2020-11-26 ENCOUNTER — Ambulatory Visit (INDEPENDENT_AMBULATORY_CARE_PROVIDER_SITE_OTHER): Payer: Medicare Other | Admitting: Vascular Surgery

## 2020-11-26 ENCOUNTER — Other Ambulatory Visit: Payer: Self-pay

## 2020-11-26 VITALS — BP 134/76 | HR 103 | Temp 98.6°F | Resp 20 | Ht 74.0 in | Wt 137.0 lb

## 2020-11-26 DIAGNOSIS — I70299 Other atherosclerosis of native arteries of extremities, unspecified extremity: Secondary | ICD-10-CM

## 2020-11-26 DIAGNOSIS — Z48812 Encounter for surgical aftercare following surgery on the circulatory system: Secondary | ICD-10-CM

## 2020-11-26 DIAGNOSIS — L97909 Non-pressure chronic ulcer of unspecified part of unspecified lower leg with unspecified severity: Secondary | ICD-10-CM

## 2020-11-26 NOTE — Progress Notes (Signed)
Patient name: Tony Fisher MRN: [id number] DOB: 1970/07/30 Sex: male  REASON FOR VISIT:   Follow-up after left below the knee amputation  HPI:   Tony Fisher is a pleasant 50 y.o. male who presented with a long history of a nonhealing wound of the left foot.  Of note he has a previous right below the knee amputation.  He wished to pursue left below the knee amputation.  This was done on 10/23/2020.  He comes in for a 1 month follow-up visit and to have his staples removed.  Overall he is doing well.  He occasionally gets some phantom pain on the left.  He denies any fever or chills.  He said no erythema or drainage.  Current Outpatient Medications  Medication Sig Dispense Refill   acetaminophen (TYLENOL) 500 MG tablet Take 1,000 mg by mouth every 6 (six) hours as needed for moderate pain.     aspirin EC 81 MG tablet Take 81 mg by mouth daily.     gabapentin (NEURONTIN) 100 MG capsule Take 100 mg by mouth 2 (two) times daily.     HYDROcodone-acetaminophen (NORCO) 5-325 MG tablet Take 1 tablet by mouth every 6 (six) hours as needed for moderate pain. 30 tablet 0   insulin aspart (NOVOLOG) 100 UNIT/ML injection Inject into the skin See admin instructions. INSULIN PUMP: basal rate 1unit per hour/ continuous infusion plus boluses     mycophenolate (MYFORTIC) 180 MG EC tablet Take 720 mg by mouth 2 (two) times daily.      pantoprazole (PROTONIX) 40 MG tablet Take 40 mg by mouth every morning.      POLY-IRON 150 150 MG capsule Take 150 mg by mouth 2 (two) times daily.   11   predniSONE (DELTASONE) 5 MG tablet Take 5 mg by mouth daily with breakfast.     sertraline (ZOLOFT) 100 MG tablet Take 100 mg by mouth every morning.      tacrolimus (PROGRAF) 1 MG capsule Take 2-3 mg by mouth See admin instructions. Take 3 mg by mouth in the morning and take 2 mg by mouth at bedtime     tamsulosin (FLOMAX) 0.4 MG CAPS capsule Take 0.4 mg by mouth daily after breakfast.      No current  facility-administered medications for this visit.    REVIEW OF SYSTEMS:  [X]  denotes positive finding, [ ]  denotes negative finding Vascular    Leg swelling    Cardiac    Chest pain or chest pressure:    Shortness of breath upon exertion:    Short of breath when lying flat:    Irregular heart rhythm:    Constitutional    Fever or chills:     PHYSICAL EXAM:   Vitals:   11/26/20 1404  BP: 134/76  Pulse: (!) 103  Resp: 20  Temp: 98.6 F (37 C)  SpO2: 98%  Weight: 137 lb (62.1 kg)  Height: 6\' 2"  (1.88 m)    GENERAL: The patient is a well-nourished male, in no acute distress. The vital signs are documented above. CARDIOVASCULAR: There is a regular rate and rhythm. PULMONARY: There is good air exchange bilaterally without wheezing or rales. EXTREMITIES: He has a left BKA is healing nicely.    DATA:   No new data  MEDICAL ISSUES:   S/P LEFT BELOW THE KNEE AMPUTATION: His left below the knee amputation site is healing nicely.  At this point I think we can work towards getting a prosthesis.  I will  see him back as needed.  The patient has a left Below Knee Amputation. The patient is well motivated to return to their prior functional status by utilizing a prosthesis to perform ADL's and maintain a healthy lifestyle. The patient has the physical and cognitive capacity to function with a prosthesis.   Functional Level: K2 Limited Community Ambulator: Has the ability or potential for ambulation and to traverse low environmental barriers such as curbs, stairs or uneven surfaces  Residual Limb History: The skin condition of the residual limb is good. The patient will continue to monitor the skin of the residual limb and follow hygiene instructions.  The patient is experiencing phantom limb pain   Prosthetic Prescription Plan: Counseling and education regarding prosthetic management will be provided to the patient via a certified prosthetist. A multi-discipline team, including  physical therapy, will manage the prosthetic fabrication, fitting and prosthetic gait training.    Deitra Mayo [facility] [place] [phone]

## 2021-02-04 ENCOUNTER — Ambulatory Visit (INDEPENDENT_AMBULATORY_CARE_PROVIDER_SITE_OTHER): Payer: Medicare Other | Admitting: Physician Assistant

## 2021-02-04 ENCOUNTER — Other Ambulatory Visit: Payer: Self-pay

## 2021-02-04 ENCOUNTER — Telehealth: Payer: Self-pay

## 2021-02-04 ENCOUNTER — Encounter: Payer: Self-pay | Admitting: Physician Assistant

## 2021-02-04 VITALS — BP 116/64 | HR 107 | Temp 98.1°F | Resp 20 | Ht 74.0 in | Wt 137.0 lb

## 2021-02-04 DIAGNOSIS — I70299 Other atherosclerosis of native arteries of extremities, unspecified extremity: Secondary | ICD-10-CM

## 2021-02-04 DIAGNOSIS — L97909 Non-pressure chronic ulcer of unspecified part of unspecified lower leg with unspecified severity: Secondary | ICD-10-CM

## 2021-02-04 NOTE — Telephone Encounter (Signed)
Patient is s/p L BKA in September by Dr. Scot Dock. Over the last 3 days or so he has noticed an open wound at the incision site. Not really red/warm and no fever or chills, but patient endorses some serosanguineous drainage and area is very tender. Placed on schedule today for wound check.

## 2021-02-04 NOTE — Progress Notes (Signed)
° ° ° ° °  CC:  F/u after a fall 3 weeks ago onto his left BKA/anterior tibia.    HPI:  This is a 51 y.o. male who is s/p left BKA on 10/23/20 by Dr. Scot Dock.  He has had his prosthetic adjusted recently to off load pressure from the tibia.  He has placed a guaze dressing over the area for padding.  He denise fever or chills and no drainage for past few days.    Allergies  Allergen Reactions   Iron Shortness Of Breath, Swelling and Other (See Comments)    IV Iron   Ace Inhibitors Hives, Swelling and Other (See Comments)    Throat swelling   Cheese Other (See Comments)    Patient does not like cheese    Current Outpatient Medications  Medication Sig Dispense Refill   acetaminophen (TYLENOL) 500 MG tablet Take 1,000 mg by mouth every 6 (six) hours as needed for moderate pain.     aspirin EC 81 MG tablet Take 81 mg by mouth daily.     gabapentin (NEURONTIN) 100 MG capsule Take 100 mg by mouth 2 (two) times daily.     HYDROcodone-acetaminophen (NORCO) 5-325 MG tablet Take 1 tablet by mouth every 6 (six) hours as needed for moderate pain. 30 tablet 0   insulin aspart (NOVOLOG) 100 UNIT/ML injection Inject into the skin See admin instructions. INSULIN PUMP: basal rate 1unit per hour/ continuous infusion plus boluses     mycophenolate (MYFORTIC) 180 MG EC tablet Take 720 mg by mouth 2 (two) times daily.      pantoprazole (PROTONIX) 40 MG tablet Take 40 mg by mouth every morning.      POLY-IRON 150 150 MG capsule Take 150 mg by mouth 2 (two) times daily.   11   predniSONE (DELTASONE) 5 MG tablet Take 5 mg by mouth daily with breakfast.     sertraline (ZOLOFT) 100 MG tablet Take 100 mg by mouth every morning.      tacrolimus (PROGRAF) 1 MG capsule Take 2-3 mg by mouth See admin instructions. Take 3 mg by mouth in the morning and take 2 mg by mouth at bedtime     tamsulosin (FLOMAX) 0.4 MG CAPS capsule Take 0.4 mg by mouth daily after breakfast.      No current facility-administered medications  for this visit.     ROS:  See HPI  Physical Exam:     The skin is intact, without opening, erythema or local edema. His skin is warm to touch.  Assessment/Plan:  This is a 51 y.o. male who is s/p: Left BKA 10/23/20 with previous right BKA > 4 years ago.  I asked him if he could not wear his prosthetic for a week and follow up for a wound check next week.  He will wear mild stump shrinker and guaze for padding as needed.  Soap and water wash with skin lotion around the dry skin area.  Protect the skin from other damage with the socks.    There is no fluctuance, drainage or erythema.         Roxy Horseman PA-C [facility] [phone]   Clinic MD:  Scot Dock

## 2021-02-11 ENCOUNTER — Ambulatory Visit (INDEPENDENT_AMBULATORY_CARE_PROVIDER_SITE_OTHER): Payer: Medicare Other | Admitting: Physician Assistant

## 2021-02-11 ENCOUNTER — Encounter: Payer: Self-pay | Admitting: Physician Assistant

## 2021-02-11 ENCOUNTER — Other Ambulatory Visit: Payer: Self-pay

## 2021-02-11 VITALS — BP 157/82 | HR 100 | Temp 98.4°F | Resp 20 | Wt 137.0 lb

## 2021-02-11 DIAGNOSIS — Z89512 Acquired absence of left leg below knee: Secondary | ICD-10-CM | POA: Diagnosis not present

## 2021-02-11 DIAGNOSIS — Z89511 Acquired absence of right leg below knee: Secondary | ICD-10-CM | POA: Diagnosis not present

## 2021-02-11 DIAGNOSIS — I70299 Other atherosclerosis of native arteries of extremities, unspecified extremity: Secondary | ICD-10-CM

## 2021-02-11 DIAGNOSIS — L97909 Non-pressure chronic ulcer of unspecified part of unspecified lower leg with unspecified severity: Secondary | ICD-10-CM
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
## 2021-03-10 ENCOUNTER — Telehealth: Payer: Self-pay

## 2021-03-10 ENCOUNTER — Other Ambulatory Visit: Payer: Self-pay

## 2021-03-10 ENCOUNTER — Ambulatory Visit (INDEPENDENT_AMBULATORY_CARE_PROVIDER_SITE_OTHER): Payer: Medicare Other | Admitting: Physician Assistant

## 2021-03-10 VITALS — BP 156/91 | HR 94 | Temp 98.6°F | Resp 20 | Ht 74.0 in

## 2021-03-10 DIAGNOSIS — I70299 Other atherosclerosis of native arteries of extremities, unspecified extremity: Secondary | ICD-10-CM

## 2021-03-10 DIAGNOSIS — L97909 Non-pressure chronic ulcer of unspecified part of unspecified lower leg with unspecified severity: Secondary | ICD-10-CM

## 2021-03-10 DIAGNOSIS — Z89511 Acquired absence of right leg below knee: Secondary | ICD-10-CM | POA: Diagnosis not present

## 2021-03-10 DIAGNOSIS — Z89512 Acquired absence of left leg below knee: Secondary | ICD-10-CM

## 2021-03-10 MED ORDER — AMOXICILLIN-POT CLAVULANATE 875-125 MG PO TABS: 1.0000 | ORAL_TABLET | Freq: Two times a day (BID) | ORAL | 1 refills | Status: AC

## 2021-03-10 NOTE — Progress Notes (Signed)
° ° ° ° °  CC:  F/u for surgery  HPI:  This is a 51 y.o. male who is here with drainage wound over the distal left BKA.  He feel and sustained a superficial abrasion over the BKA stump at the beginning of January. The area was healing well on his last visit and he had a very small scab.  He started using his prosthetic limb again on the left for short times.  He states this am the wound was erythematous and drainage straw colored fluid.  He denies fever and chills.    .    Allergies  Allergen Reactions   Iron Shortness Of Breath, Swelling and Other (See Comments)    IV Iron   Ace Inhibitors Hives, Swelling and Other (See Comments)    Throat swelling   Cheese Other (See Comments)    Patient does not like cheese    Current Outpatient Medications  Medication Sig Dispense Refill   amoxicillin-clavulanate (AUGMENTIN) 875-125 MG tablet Take 1 tablet by mouth every 12 (twelve) hours. 20 tablet 1   aspirin EC 81 MG tablet Take 81 mg by mouth daily.     gabapentin (NEURONTIN) 100 MG capsule Take 100 mg by mouth 2 (two) times daily.     insulin aspart (NOVOLOG) 100 UNIT/ML injection Inject into the skin See admin instructions. INSULIN PUMP: basal rate 1unit per hour/ continuous infusion plus boluses     mycophenolate (MYFORTIC) 180 MG EC tablet Take 720 mg by mouth 2 (two) times daily.      pantoprazole (PROTONIX) 40 MG tablet Take 40 mg by mouth every morning.      POLY-IRON 150 150 MG capsule Take 150 mg by mouth 2 (two) times daily.   11   predniSONE (DELTASONE) 5 MG tablet Take 5 mg by mouth daily with breakfast.     sertraline (ZOLOFT) 100 MG tablet Take 100 mg by mouth every morning.      tacrolimus (PROGRAF) 1 MG capsule Take 2-3 mg by mouth See admin instructions. Take 3 mg by mouth in the morning and take 2 mg by mouth at bedtime     tamsulosin (FLOMAX) 0.4 MG CAPS capsule Take 0.4 mg by mouth daily after breakfast.      No current facility-administered medications for this visit.      ROS:  See HPI  Physical Exam:      Extremities:  warm with minimal fluctuance at the drainage site.    Lungs: non labored breathing  Heart RRR  Assessment/Plan:  This is a 51 y.o. male who is s/p:S/p left BKA with non healing wounds.  Recent erythema and drainage.  I will prescribe Augment q 12, wash with soap and water as needed, dry dressing PRN and he will f/u in 1 week for wound check.  He is at risk for revision or the BKA verse AKA if the wound fails to heal.  He will not use the his prosthetic until it is fully healed.   He has a previous right BKA > 4 years ago that has healed well.       Roxy Horseman PA-C [facility] [phone]   Clinic MD:  Donzetta Matters

## 2021-03-10 NOTE — Telephone Encounter (Signed)
Pt called to let us know he fell on his stump and his having some drainage and feels it is warm to touch. He is wanting to be seen ASAP and has been added on to APP schedule today.

## 2021-03-12 DIAGNOSIS — Z89512 Acquired absence of left leg below knee: Secondary | ICD-10-CM | POA: Diagnosis not present

## 2021-03-12 DIAGNOSIS — Z9641 Presence of insulin pump (external) (internal): Secondary | ICD-10-CM | POA: Diagnosis not present

## 2021-03-12 DIAGNOSIS — I10 Essential (primary) hypertension: Secondary | ICD-10-CM | POA: Diagnosis not present

## 2021-03-12 DIAGNOSIS — E1021 Type 1 diabetes mellitus with diabetic nephropathy: Secondary | ICD-10-CM | POA: Diagnosis not present

## 2021-03-12 DIAGNOSIS — Z89511 Acquired absence of right leg below knee: Secondary | ICD-10-CM | POA: Diagnosis not present

## 2021-03-12 DIAGNOSIS — Z940 Kidney transplant status: Secondary | ICD-10-CM | POA: Diagnosis not present

## 2021-03-12 DIAGNOSIS — Z9483 Pancreas transplant status: Secondary | ICD-10-CM | POA: Diagnosis not present

## 2021-03-12 DIAGNOSIS — E1040 Type 1 diabetes mellitus with diabetic neuropathy, unspecified: Secondary | ICD-10-CM | POA: Diagnosis not present

## 2021-03-12 DIAGNOSIS — E1065 Type 1 diabetes mellitus with hyperglycemia: Secondary | ICD-10-CM | POA: Diagnosis not present

## 2021-03-18 ENCOUNTER — Ambulatory Visit (INDEPENDENT_AMBULATORY_CARE_PROVIDER_SITE_OTHER): Payer: Medicare Other | Admitting: Physician Assistant

## 2021-03-18 ENCOUNTER — Other Ambulatory Visit: Payer: Self-pay

## 2021-03-18 VITALS — BP 146/86 | HR 95 | Temp 98.4°F | Resp 20 | Ht 74.0 in | Wt 137.0 lb

## 2021-03-18 DIAGNOSIS — I70299 Other atherosclerosis of native arteries of extremities, unspecified extremity: Secondary | ICD-10-CM | POA: Diagnosis not present

## 2021-03-18 DIAGNOSIS — L97909 Non-pressure chronic ulcer of unspecified part of unspecified lower leg with unspecified severity: Secondary | ICD-10-CM | POA: Diagnosis not present

## 2021-03-18 DIAGNOSIS — Z89512 Acquired absence of left leg below knee: Secondary | ICD-10-CM | POA: Diagnosis not present

## 2021-03-18 DIAGNOSIS — Z89511 Acquired absence of right leg below knee: Secondary | ICD-10-CM | POA: Diagnosis not present
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
## 2021-03-24 ENCOUNTER — Other Ambulatory Visit: Payer: Self-pay

## 2021-03-24 ENCOUNTER — Encounter: Payer: Self-pay | Admitting: Vascular Surgery

## 2021-03-24 ENCOUNTER — Ambulatory Visit (INDEPENDENT_AMBULATORY_CARE_PROVIDER_SITE_OTHER): Payer: Medicare Other | Admitting: Vascular Surgery

## 2021-03-24 VITALS — BP 131/92 | HR 112 | Temp 98.6°F | Resp 20 | Ht 74.0 in | Wt 137.0 lb

## 2021-03-24 DIAGNOSIS — Z89511 Acquired absence of right leg below knee: Secondary | ICD-10-CM

## 2021-03-24 DIAGNOSIS — Z89512 Acquired absence of left leg below knee: Secondary | ICD-10-CM | POA: Diagnosis not present
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
## 2021-03-31 DIAGNOSIS — Z7960 Long term (current) use of unspecified immunomodulators and immunosuppressants: Secondary | ICD-10-CM | POA: Diagnosis not present

## 2021-03-31 DIAGNOSIS — Z940 Kidney transplant status: Secondary | ICD-10-CM | POA: Diagnosis not present

## 2021-03-31 DIAGNOSIS — Z5181 Encounter for therapeutic drug level monitoring: Secondary | ICD-10-CM | POA: Diagnosis not present

## 2021-03-31 DIAGNOSIS — Z79899 Other long term (current) drug therapy: Secondary | ICD-10-CM | POA: Diagnosis not present

## 2021-03-31 DIAGNOSIS — R7989 Other specified abnormal findings of blood chemistry: Secondary | ICD-10-CM | POA: Diagnosis not present

## 2021-03-31 DIAGNOSIS — I10 Essential (primary) hypertension: Secondary | ICD-10-CM | POA: Diagnosis not present

## 2021-03-31 DIAGNOSIS — I951 Orthostatic hypotension: Secondary | ICD-10-CM | POA: Diagnosis not present

## 2021-03-31 DIAGNOSIS — Z7952 Long term (current) use of systemic steroids: Secondary | ICD-10-CM | POA: Diagnosis not present

## 2021-03-31 DIAGNOSIS — E109 Type 1 diabetes mellitus without complications: Secondary | ICD-10-CM | POA: Diagnosis not present

## 2021-03-31 DIAGNOSIS — Z79621 Long term (current) use of calcineurin inhibitor: Secondary | ICD-10-CM | POA: Diagnosis not present

## 2021-03-31 DIAGNOSIS — D849 Immunodeficiency, unspecified: Secondary | ICD-10-CM | POA: Diagnosis not present

## 2021-03-31 DIAGNOSIS — Z794 Long term (current) use of insulin: Secondary | ICD-10-CM | POA: Diagnosis not present

## 2021-03-31 DIAGNOSIS — Z9483 Pancreas transplant status: Secondary | ICD-10-CM | POA: Diagnosis not present

## 2021-03-31 DIAGNOSIS — Z7982 Long term (current) use of aspirin: Secondary | ICD-10-CM | POA: Diagnosis not present

## 2021-03-31 DIAGNOSIS — G903 Multi-system degeneration of the autonomic nervous system: Secondary | ICD-10-CM | POA: Diagnosis not present

## 2021-03-31 DIAGNOSIS — Z4822 Encounter for aftercare following kidney transplant: Secondary | ICD-10-CM | POA: Diagnosis not present

## 2021-03-31 DIAGNOSIS — Z89512 Acquired absence of left leg below knee: Secondary | ICD-10-CM | POA: Diagnosis not present

## 2021-03-31 DIAGNOSIS — E1021 Type 1 diabetes mellitus with diabetic nephropathy: Secondary | ICD-10-CM | POA: Diagnosis not present

## 2021-04-01 ENCOUNTER — Ambulatory Visit: Payer: Medicare Other | Admitting: Vascular Surgery

## 2021-04-07 ENCOUNTER — Ambulatory Visit (INDEPENDENT_AMBULATORY_CARE_PROVIDER_SITE_OTHER): Payer: Medicare Other | Admitting: Vascular Surgery

## 2021-04-07 ENCOUNTER — Other Ambulatory Visit: Payer: Self-pay

## 2021-04-07 ENCOUNTER — Encounter: Payer: Self-pay | Admitting: Vascular Surgery

## 2021-04-07 VITALS — BP 153/82 | HR 109 | Temp 98.3°F | Resp 20 | Ht 74.0 in | Wt 137.0 lb

## 2021-04-07 DIAGNOSIS — Z89511 Acquired absence of right leg below knee: Secondary | ICD-10-CM | POA: Diagnosis not present

## 2021-04-07 DIAGNOSIS — L98499 Non-pressure chronic ulcer of skin of other sites with unspecified severity: Secondary | ICD-10-CM

## 2021-04-07 DIAGNOSIS — Z89512 Acquired absence of left leg below knee: Secondary | ICD-10-CM
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
## 2021-05-19 DIAGNOSIS — Z20822 Contact with and (suspected) exposure to covid-19: Secondary | ICD-10-CM | POA: Diagnosis not present

## 2021-06-11 DIAGNOSIS — Z89512 Acquired absence of left leg below knee: Secondary | ICD-10-CM | POA: Diagnosis not present

## 2021-06-11 DIAGNOSIS — E1040 Type 1 diabetes mellitus with diabetic neuropathy, unspecified: Secondary | ICD-10-CM | POA: Diagnosis not present

## 2021-06-11 DIAGNOSIS — Z940 Kidney transplant status: Secondary | ICD-10-CM | POA: Diagnosis not present

## 2021-06-11 DIAGNOSIS — Z89511 Acquired absence of right leg below knee: Secondary | ICD-10-CM | POA: Diagnosis not present

## 2021-06-11 DIAGNOSIS — I10 Essential (primary) hypertension: Secondary | ICD-10-CM | POA: Diagnosis not present

## 2021-06-11 DIAGNOSIS — E1029 Type 1 diabetes mellitus with other diabetic kidney complication: Secondary | ICD-10-CM | POA: Diagnosis not present

## 2021-06-11 DIAGNOSIS — Z9483 Pancreas transplant status: Secondary | ICD-10-CM | POA: Diagnosis not present

## 2021-06-11 DIAGNOSIS — Z9641 Presence of insulin pump (external) (internal): Secondary | ICD-10-CM | POA: Diagnosis not present

## 2021-09-06 ENCOUNTER — Other Ambulatory Visit: Payer: Self-pay | Admitting: *Deleted

## 2021-09-06 NOTE — Patient Outreach (Addendum)
  Care Coordination   Initial Visit Note   09/06/2021 Name: DEAVEON SCHOEN MRN: [id number] DOB: 04-19-1970  RYDGE TEXIDOR is a 51 y.o. year old male who sees Edrick Oh, MD for primary care. I spoke with  Melina Fiddler by phone today  What matters to the patients health and wellness today?  EVERTHING IS IN ORDER. PRIMARY CARE OFFICE IS [facility] [address]. PT STATES HE IS ON TRACK WITH ALL HIS MEDICAL NEEDS.  SDOH assessments and interventions completed:  No  care Coordination Interventions Activated:  No  Care Coordination Interventions:  No, not indicated   Follow up plan: No further intervention required.   Encounter Outcome:  Pt. Refused   Takoda Siedlecki C. Myrtie Neither, MSN, [facility] Gerontological Nurse Practitioner [facility] Care Management [phone]

## 2021-09-14 DIAGNOSIS — Z9641 Presence of insulin pump (external) (internal): Secondary | ICD-10-CM | POA: Diagnosis not present

## 2021-09-14 DIAGNOSIS — Z89511 Acquired absence of right leg below knee: Secondary | ICD-10-CM | POA: Diagnosis not present

## 2021-09-14 DIAGNOSIS — E10319 Type 1 diabetes mellitus with unspecified diabetic retinopathy without macular edema: Secondary | ICD-10-CM | POA: Diagnosis not present

## 2021-09-14 DIAGNOSIS — E1065 Type 1 diabetes mellitus with hyperglycemia: Secondary | ICD-10-CM | POA: Diagnosis not present

## 2021-09-14 DIAGNOSIS — I10 Essential (primary) hypertension: Secondary | ICD-10-CM | POA: Diagnosis not present

## 2021-09-14 DIAGNOSIS — Z9483 Pancreas transplant status: Secondary | ICD-10-CM | POA: Diagnosis not present

## 2021-09-14 DIAGNOSIS — Z940 Kidney transplant status: Secondary | ICD-10-CM | POA: Diagnosis not present

## 2021-09-14 DIAGNOSIS — E1040 Type 1 diabetes mellitus with diabetic neuropathy, unspecified: Secondary | ICD-10-CM | POA: Diagnosis not present

## 2021-09-14 DIAGNOSIS — Z89512 Acquired absence of left leg below knee: Secondary | ICD-10-CM | POA: Diagnosis not present

## 2021-09-14 DIAGNOSIS — E1021 Type 1 diabetes mellitus with diabetic nephropathy: Secondary | ICD-10-CM | POA: Diagnosis not present

## 2021-09-30 DIAGNOSIS — Z940 Kidney transplant status: Secondary | ICD-10-CM | POA: Diagnosis not present

## 2021-09-30 DIAGNOSIS — Z7689 Persons encountering health services in other specified circumstances: Secondary | ICD-10-CM | POA: Diagnosis not present

## 2021-09-30 DIAGNOSIS — E1021 Type 1 diabetes mellitus with diabetic nephropathy: Secondary | ICD-10-CM | POA: Diagnosis not present

## 2021-09-30 DIAGNOSIS — N1831 Chronic kidney disease, stage 3a: Secondary | ICD-10-CM | POA: Diagnosis not present

## 2021-09-30 DIAGNOSIS — E103513 Type 1 diabetes mellitus with proliferative diabetic retinopathy with macular edema, bilateral: Secondary | ICD-10-CM | POA: Diagnosis not present

## 2021-09-30 DIAGNOSIS — F324 Major depressive disorder, single episode, in partial remission: Secondary | ICD-10-CM | POA: Diagnosis not present

## 2021-09-30 DIAGNOSIS — Z89511 Acquired absence of right leg below knee: Secondary | ICD-10-CM | POA: Diagnosis not present

## 2021-09-30 DIAGNOSIS — Z89512 Acquired absence of left leg below knee: Secondary | ICD-10-CM | POA: Diagnosis not present

## 2021-09-30 DIAGNOSIS — Z5320 Procedure and treatment not carried out because of patient's decision for unspecified reasons: Secondary | ICD-10-CM | POA: Diagnosis not present

## 2021-09-30 DIAGNOSIS — I739 Peripheral vascular disease, unspecified: Secondary | ICD-10-CM | POA: Diagnosis not present

## 2021-11-05 DIAGNOSIS — Z1212 Encounter for screening for malignant neoplasm of rectum: Secondary | ICD-10-CM | POA: Diagnosis not present

## 2021-11-05 DIAGNOSIS — Z1211 Encounter for screening for malignant neoplasm of colon: Secondary | ICD-10-CM | POA: Diagnosis not present

## 2021-11-09 DIAGNOSIS — N183 Chronic kidney disease, stage 3 unspecified: Secondary | ICD-10-CM | POA: Diagnosis not present

## 2021-11-09 DIAGNOSIS — Z7960 Long term (current) use of unspecified immunomodulators and immunosuppressants: Secondary | ICD-10-CM | POA: Diagnosis not present

## 2021-11-09 DIAGNOSIS — I129 Hypertensive chronic kidney disease with stage 1 through stage 4 chronic kidney disease, or unspecified chronic kidney disease: Secondary | ICD-10-CM | POA: Diagnosis not present

## 2021-11-09 DIAGNOSIS — D849 Immunodeficiency, unspecified: Secondary | ICD-10-CM | POA: Diagnosis not present

## 2021-11-09 DIAGNOSIS — Z940 Kidney transplant status: Secondary | ICD-10-CM | POA: Diagnosis not present

## 2021-11-09 DIAGNOSIS — E1021 Type 1 diabetes mellitus with diabetic nephropathy: Secondary | ICD-10-CM | POA: Diagnosis not present

## 2022-02-16 DIAGNOSIS — D849 Immunodeficiency, unspecified: Secondary | ICD-10-CM | POA: Diagnosis not present

## 2022-02-16 DIAGNOSIS — I129 Hypertensive chronic kidney disease with stage 1 through stage 4 chronic kidney disease, or unspecified chronic kidney disease: Secondary | ICD-10-CM | POA: Diagnosis not present

## 2022-02-16 DIAGNOSIS — Z940 Kidney transplant status: Secondary | ICD-10-CM | POA: Diagnosis not present

## 2022-02-16 DIAGNOSIS — N183 Chronic kidney disease, stage 3 unspecified: Secondary | ICD-10-CM | POA: Diagnosis not present

## 2022-02-17 DIAGNOSIS — D61818 Other pancytopenia: Secondary | ICD-10-CM | POA: Diagnosis not present

## 2022-02-17 DIAGNOSIS — Z794 Long term (current) use of insulin: Secondary | ICD-10-CM | POA: Diagnosis not present

## 2022-02-17 DIAGNOSIS — E559 Vitamin D deficiency, unspecified: Secondary | ICD-10-CM | POA: Diagnosis not present

## 2022-02-17 DIAGNOSIS — N183 Chronic kidney disease, stage 3 unspecified: Secondary | ICD-10-CM | POA: Diagnosis not present

## 2022-02-17 DIAGNOSIS — Z79621 Long term (current) use of calcineurin inhibitor: Secondary | ICD-10-CM | POA: Diagnosis not present

## 2022-02-17 DIAGNOSIS — Z940 Kidney transplant status: Secondary | ICD-10-CM | POA: Diagnosis not present

## 2022-02-17 DIAGNOSIS — E103593 Type 1 diabetes mellitus with proliferative diabetic retinopathy without macular edema, bilateral: Secondary | ICD-10-CM | POA: Diagnosis not present

## 2022-02-17 DIAGNOSIS — I129 Hypertensive chronic kidney disease with stage 1 through stage 4 chronic kidney disease, or unspecified chronic kidney disease: Secondary | ICD-10-CM | POA: Diagnosis not present

## 2022-02-17 DIAGNOSIS — D84821 Immunodeficiency due to drugs: Secondary | ICD-10-CM | POA: Diagnosis not present

## 2022-02-17 DIAGNOSIS — Z1331 Encounter for screening for depression: Secondary | ICD-10-CM | POA: Diagnosis not present

## 2022-02-17 DIAGNOSIS — E1022 Type 1 diabetes mellitus with diabetic chronic kidney disease: Secondary | ICD-10-CM | POA: Diagnosis not present

## 2022-02-17 DIAGNOSIS — Z9641 Presence of insulin pump (external) (internal): Secondary | ICD-10-CM | POA: Diagnosis not present

## 2022-02-23 DIAGNOSIS — Z940 Kidney transplant status: Secondary | ICD-10-CM | POA: Diagnosis not present

## 2022-02-23 DIAGNOSIS — Z7960 Long term (current) use of unspecified immunomodulators and immunosuppressants: Secondary | ICD-10-CM | POA: Diagnosis not present

## 2022-03-23 DIAGNOSIS — Z9641 Presence of insulin pump (external) (internal): Secondary | ICD-10-CM | POA: Diagnosis not present

## 2022-03-23 DIAGNOSIS — E10649 Type 1 diabetes mellitus with hypoglycemia without coma: Secondary | ICD-10-CM | POA: Diagnosis not present

## 2022-03-23 DIAGNOSIS — Z940 Kidney transplant status: Secondary | ICD-10-CM | POA: Diagnosis not present

## 2022-03-23 DIAGNOSIS — E10319 Type 1 diabetes mellitus with unspecified diabetic retinopathy without macular edema: Secondary | ICD-10-CM | POA: Diagnosis not present

## 2022-03-23 DIAGNOSIS — Z9483 Pancreas transplant status: Secondary | ICD-10-CM | POA: Diagnosis not present

## 2022-03-23 DIAGNOSIS — Z4681 Encounter for fitting and adjustment of insulin pump: Secondary | ICD-10-CM | POA: Diagnosis not present

## 2022-03-23 DIAGNOSIS — E1021 Type 1 diabetes mellitus with diabetic nephropathy: Secondary | ICD-10-CM | POA: Diagnosis not present

## 2022-03-23 DIAGNOSIS — Z89512 Acquired absence of left leg below knee: Secondary | ICD-10-CM | POA: Diagnosis not present

## 2022-03-23 DIAGNOSIS — Z89511 Acquired absence of right leg below knee: Secondary | ICD-10-CM | POA: Diagnosis not present

## 2022-03-23 DIAGNOSIS — E1040 Type 1 diabetes mellitus with diabetic neuropathy, unspecified: Secondary | ICD-10-CM | POA: Diagnosis not present

## 2022-03-30 DIAGNOSIS — E1021 Type 1 diabetes mellitus with diabetic nephropathy: Secondary | ICD-10-CM | POA: Diagnosis not present

## 2022-03-30 DIAGNOSIS — Z4681 Encounter for fitting and adjustment of insulin pump: Secondary | ICD-10-CM | POA: Diagnosis not present

## 2022-03-30 DIAGNOSIS — Z9641 Presence of insulin pump (external) (internal): Secondary | ICD-10-CM | POA: Diagnosis not present

## 2022-03-31 DIAGNOSIS — Z89511 Acquired absence of right leg below knee: Secondary | ICD-10-CM | POA: Diagnosis not present

## 2022-03-31 DIAGNOSIS — N1831 Chronic kidney disease, stage 3a: Secondary | ICD-10-CM | POA: Diagnosis not present

## 2022-03-31 DIAGNOSIS — Z940 Kidney transplant status: Secondary | ICD-10-CM | POA: Diagnosis not present

## 2022-03-31 DIAGNOSIS — F324 Major depressive disorder, single episode, in partial remission: Secondary | ICD-10-CM | POA: Diagnosis not present

## 2022-03-31 DIAGNOSIS — Z7189 Other specified counseling: Secondary | ICD-10-CM | POA: Diagnosis not present

## 2022-03-31 DIAGNOSIS — Z89512 Acquired absence of left leg below knee: Secondary | ICD-10-CM | POA: Diagnosis not present

## 2022-03-31 DIAGNOSIS — Z0000 Encounter for general adult medical examination without abnormal findings: Secondary | ICD-10-CM | POA: Diagnosis not present

## 2022-03-31 DIAGNOSIS — E1021 Type 1 diabetes mellitus with diabetic nephropathy: Secondary | ICD-10-CM | POA: Diagnosis not present

## 2022-04-25 DIAGNOSIS — Z9641 Presence of insulin pump (external) (internal): Secondary | ICD-10-CM | POA: Diagnosis not present

## 2022-04-25 DIAGNOSIS — Z4681 Encounter for fitting and adjustment of insulin pump: Secondary | ICD-10-CM | POA: Diagnosis not present

## 2022-04-25 DIAGNOSIS — E1021 Type 1 diabetes mellitus with diabetic nephropathy: Secondary | ICD-10-CM | POA: Diagnosis not present

## 2022-06-07 DIAGNOSIS — N1831 Chronic kidney disease, stage 3a: Secondary | ICD-10-CM | POA: Diagnosis not present

## 2022-06-07 DIAGNOSIS — D849 Immunodeficiency, unspecified: Secondary | ICD-10-CM | POA: Diagnosis not present

## 2022-06-07 DIAGNOSIS — T86891 Other transplanted tissue failure: Secondary | ICD-10-CM | POA: Diagnosis not present

## 2022-06-07 DIAGNOSIS — E1069 Type 1 diabetes mellitus with other specified complication: Secondary | ICD-10-CM | POA: Diagnosis not present

## 2022-06-07 DIAGNOSIS — E1022 Type 1 diabetes mellitus with diabetic chronic kidney disease: Secondary | ICD-10-CM | POA: Diagnosis not present

## 2022-06-07 DIAGNOSIS — Z9483 Pancreas transplant status: Secondary | ICD-10-CM | POA: Diagnosis not present

## 2022-06-07 DIAGNOSIS — Z940 Kidney transplant status: Secondary | ICD-10-CM | POA: Diagnosis not present

## 2022-06-20 DIAGNOSIS — N1832 Chronic kidney disease, stage 3b: Secondary | ICD-10-CM | POA: Diagnosis not present

## 2022-06-20 DIAGNOSIS — Z5181 Encounter for therapeutic drug level monitoring: Secondary | ICD-10-CM | POA: Diagnosis not present

## 2022-06-20 DIAGNOSIS — E559 Vitamin D deficiency, unspecified: Secondary | ICD-10-CM | POA: Diagnosis not present

## 2022-06-20 DIAGNOSIS — Z7960 Long term (current) use of unspecified immunomodulators and immunosuppressants: Secondary | ICD-10-CM | POA: Diagnosis not present

## 2022-06-20 DIAGNOSIS — I129 Hypertensive chronic kidney disease with stage 1 through stage 4 chronic kidney disease, or unspecified chronic kidney disease: Secondary | ICD-10-CM | POA: Diagnosis not present

## 2022-06-20 DIAGNOSIS — Z79621 Long term (current) use of calcineurin inhibitor: Secondary | ICD-10-CM | POA: Diagnosis not present

## 2022-06-20 DIAGNOSIS — E1022 Type 1 diabetes mellitus with diabetic chronic kidney disease: Secondary | ICD-10-CM | POA: Diagnosis not present

## 2022-06-20 DIAGNOSIS — Z940 Kidney transplant status: Secondary | ICD-10-CM | POA: Diagnosis not present

## 2022-06-20 DIAGNOSIS — M898X9 Other specified disorders of bone, unspecified site: Secondary | ICD-10-CM | POA: Diagnosis not present

## 2022-07-19 DIAGNOSIS — Z940 Kidney transplant status: Secondary | ICD-10-CM | POA: Diagnosis not present

## 2022-07-19 DIAGNOSIS — Z9641 Presence of insulin pump (external) (internal): Secondary | ICD-10-CM | POA: Diagnosis not present

## 2022-07-19 DIAGNOSIS — Z89512 Acquired absence of left leg below knee: Secondary | ICD-10-CM | POA: Diagnosis not present

## 2022-07-19 DIAGNOSIS — E1040 Type 1 diabetes mellitus with diabetic neuropathy, unspecified: Secondary | ICD-10-CM | POA: Diagnosis not present

## 2022-07-19 DIAGNOSIS — E1021 Type 1 diabetes mellitus with diabetic nephropathy: Secondary | ICD-10-CM | POA: Diagnosis not present

## 2022-07-19 DIAGNOSIS — Z89511 Acquired absence of right leg below knee: Secondary | ICD-10-CM | POA: Diagnosis not present

## 2022-07-19 DIAGNOSIS — S81802A Unspecified open wound, left lower leg, initial encounter: Secondary | ICD-10-CM | POA: Diagnosis not present

## 2022-07-19 DIAGNOSIS — Z9483 Pancreas transplant status: Secondary | ICD-10-CM | POA: Diagnosis not present

## 2022-08-09 DIAGNOSIS — Z7952 Long term (current) use of systemic steroids: Secondary | ICD-10-CM | POA: Diagnosis not present

## 2022-08-09 DIAGNOSIS — Z89512 Acquired absence of left leg below knee: Secondary | ICD-10-CM | POA: Diagnosis not present

## 2022-08-09 DIAGNOSIS — E10622 Type 1 diabetes mellitus with other skin ulcer: Secondary | ICD-10-CM | POA: Diagnosis not present

## 2022-08-09 DIAGNOSIS — E109 Type 1 diabetes mellitus without complications: Secondary | ICD-10-CM | POA: Diagnosis not present

## 2022-08-09 DIAGNOSIS — L97822 Non-pressure chronic ulcer of other part of left lower leg with fat layer exposed: Secondary | ICD-10-CM | POA: Diagnosis not present

## 2022-08-09 DIAGNOSIS — S81802D Unspecified open wound, left lower leg, subsequent encounter: Secondary | ICD-10-CM | POA: Diagnosis not present

## 2022-08-16 DIAGNOSIS — Z89512 Acquired absence of left leg below knee: Secondary | ICD-10-CM | POA: Diagnosis not present

## 2022-08-16 DIAGNOSIS — E10622 Type 1 diabetes mellitus with other skin ulcer: Secondary | ICD-10-CM | POA: Diagnosis not present

## 2022-08-16 DIAGNOSIS — L97822 Non-pressure chronic ulcer of other part of left lower leg with fat layer exposed: Secondary | ICD-10-CM | POA: Diagnosis not present

## 2022-08-16 DIAGNOSIS — L97922 Non-pressure chronic ulcer of unspecified part of left lower leg with fat layer exposed: Secondary | ICD-10-CM | POA: Diagnosis not present

## 2022-08-16 DIAGNOSIS — Z7952 Long term (current) use of systemic steroids: Secondary | ICD-10-CM | POA: Diagnosis not present

## 2022-08-16 DIAGNOSIS — S81802D Unspecified open wound, left lower leg, subsequent encounter: Secondary | ICD-10-CM | POA: Diagnosis not present

## 2022-08-23 DIAGNOSIS — L97822 Non-pressure chronic ulcer of other part of left lower leg with fat layer exposed: Secondary | ICD-10-CM | POA: Diagnosis not present

## 2022-08-23 DIAGNOSIS — E10622 Type 1 diabetes mellitus with other skin ulcer: Secondary | ICD-10-CM | POA: Diagnosis not present

## 2022-08-23 DIAGNOSIS — Z7952 Long term (current) use of systemic steroids: Secondary | ICD-10-CM | POA: Diagnosis not present

## 2022-08-23 DIAGNOSIS — L97922 Non-pressure chronic ulcer of unspecified part of left lower leg with fat layer exposed: Secondary | ICD-10-CM | POA: Diagnosis not present

## 2022-08-23 DIAGNOSIS — Z89512 Acquired absence of left leg below knee: Secondary | ICD-10-CM | POA: Diagnosis not present

## 2022-08-23 DIAGNOSIS — S81802D Unspecified open wound, left lower leg, subsequent encounter: Secondary | ICD-10-CM | POA: Diagnosis not present

## 2022-08-30 DIAGNOSIS — Z7952 Long term (current) use of systemic steroids: Secondary | ICD-10-CM | POA: Diagnosis not present

## 2022-08-30 DIAGNOSIS — L97822 Non-pressure chronic ulcer of other part of left lower leg with fat layer exposed: Secondary | ICD-10-CM | POA: Diagnosis not present

## 2022-08-30 DIAGNOSIS — S81802D Unspecified open wound, left lower leg, subsequent encounter: Secondary | ICD-10-CM | POA: Diagnosis not present

## 2022-08-30 DIAGNOSIS — E10622 Type 1 diabetes mellitus with other skin ulcer: Secondary | ICD-10-CM | POA: Diagnosis not present

## 2022-08-30 DIAGNOSIS — L97922 Non-pressure chronic ulcer of unspecified part of left lower leg with fat layer exposed: Secondary | ICD-10-CM | POA: Diagnosis not present

## 2022-08-30 DIAGNOSIS — Z89512 Acquired absence of left leg below knee: Secondary | ICD-10-CM | POA: Diagnosis not present

## 2022-09-20 DIAGNOSIS — Z89512 Acquired absence of left leg below knee: Secondary | ICD-10-CM | POA: Diagnosis not present

## 2022-09-20 DIAGNOSIS — Z7952 Long term (current) use of systemic steroids: Secondary | ICD-10-CM | POA: Diagnosis not present

## 2022-09-20 DIAGNOSIS — L97922 Non-pressure chronic ulcer of unspecified part of left lower leg with fat layer exposed: Secondary | ICD-10-CM | POA: Diagnosis not present

## 2022-09-20 DIAGNOSIS — L97822 Non-pressure chronic ulcer of other part of left lower leg with fat layer exposed: Secondary | ICD-10-CM | POA: Diagnosis not present

## 2022-09-20 DIAGNOSIS — E10622 Type 1 diabetes mellitus with other skin ulcer: Secondary | ICD-10-CM | POA: Diagnosis not present

## 2022-09-29 DIAGNOSIS — E559 Vitamin D deficiency, unspecified: Secondary | ICD-10-CM | POA: Diagnosis not present

## 2022-09-29 DIAGNOSIS — L97922 Non-pressure chronic ulcer of unspecified part of left lower leg with fat layer exposed: Secondary | ICD-10-CM | POA: Diagnosis not present

## 2022-09-29 DIAGNOSIS — I10 Essential (primary) hypertension: Secondary | ICD-10-CM | POA: Diagnosis not present

## 2022-09-29 DIAGNOSIS — Z940 Kidney transplant status: Secondary | ICD-10-CM | POA: Diagnosis not present

## 2022-09-29 DIAGNOSIS — E1051 Type 1 diabetes mellitus with diabetic peripheral angiopathy without gangrene: Secondary | ICD-10-CM | POA: Diagnosis not present

## 2022-09-29 DIAGNOSIS — Z794 Long term (current) use of insulin: Secondary | ICD-10-CM | POA: Diagnosis not present

## 2022-09-29 DIAGNOSIS — Z79899 Other long term (current) drug therapy: Secondary | ICD-10-CM | POA: Diagnosis not present

## 2022-09-29 DIAGNOSIS — E10622 Type 1 diabetes mellitus with other skin ulcer: Secondary | ICD-10-CM | POA: Diagnosis not present

## 2022-09-29 DIAGNOSIS — E103553 Type 1 diabetes mellitus with stable proliferative diabetic retinopathy, bilateral: Secondary | ICD-10-CM | POA: Diagnosis not present

## 2022-10-11 DIAGNOSIS — Z7952 Long term (current) use of systemic steroids: Secondary | ICD-10-CM | POA: Diagnosis not present

## 2022-10-11 DIAGNOSIS — L97821 Non-pressure chronic ulcer of other part of left lower leg limited to breakdown of skin: Secondary | ICD-10-CM | POA: Diagnosis not present

## 2022-10-11 DIAGNOSIS — Z794 Long term (current) use of insulin: Secondary | ICD-10-CM | POA: Diagnosis not present

## 2022-10-11 DIAGNOSIS — Z89512 Acquired absence of left leg below knee: Secondary | ICD-10-CM | POA: Diagnosis not present

## 2022-10-11 DIAGNOSIS — L97922 Non-pressure chronic ulcer of unspecified part of left lower leg with fat layer exposed: Secondary | ICD-10-CM | POA: Diagnosis not present

## 2022-10-11 DIAGNOSIS — E10622 Type 1 diabetes mellitus with other skin ulcer: Secondary | ICD-10-CM | POA: Diagnosis not present

## 2022-11-01 DIAGNOSIS — Z940 Kidney transplant status: Secondary | ICD-10-CM | POA: Diagnosis not present

## 2022-11-01 DIAGNOSIS — Z794 Long term (current) use of insulin: Secondary | ICD-10-CM | POA: Diagnosis not present

## 2022-11-01 DIAGNOSIS — E10622 Type 1 diabetes mellitus with other skin ulcer: Secondary | ICD-10-CM | POA: Diagnosis not present

## 2022-11-01 DIAGNOSIS — L97922 Non-pressure chronic ulcer of unspecified part of left lower leg with fat layer exposed: Secondary | ICD-10-CM | POA: Diagnosis not present

## 2022-11-01 DIAGNOSIS — Z89512 Acquired absence of left leg below knee: Secondary | ICD-10-CM | POA: Diagnosis not present

## 2022-11-02 DIAGNOSIS — E1021 Type 1 diabetes mellitus with diabetic nephropathy: Secondary | ICD-10-CM | POA: Diagnosis not present

## 2022-11-08 DIAGNOSIS — Z940 Kidney transplant status: Secondary | ICD-10-CM | POA: Diagnosis not present

## 2022-11-08 DIAGNOSIS — Z7960 Long term (current) use of unspecified immunomodulators and immunosuppressants: Secondary | ICD-10-CM | POA: Diagnosis not present

## 2022-11-08 DIAGNOSIS — I129 Hypertensive chronic kidney disease with stage 1 through stage 4 chronic kidney disease, or unspecified chronic kidney disease: Secondary | ICD-10-CM | POA: Diagnosis not present

## 2022-11-08 DIAGNOSIS — N183 Chronic kidney disease, stage 3 unspecified: Secondary | ICD-10-CM | POA: Diagnosis not present

## 2022-11-09 DIAGNOSIS — N1831 Chronic kidney disease, stage 3a: Secondary | ICD-10-CM | POA: Diagnosis not present

## 2022-11-09 DIAGNOSIS — Z79899 Other long term (current) drug therapy: Secondary | ICD-10-CM | POA: Diagnosis not present

## 2022-11-09 DIAGNOSIS — Z79621 Long term (current) use of calcineurin inhibitor: Secondary | ICD-10-CM | POA: Diagnosis not present

## 2022-11-09 DIAGNOSIS — E559 Vitamin D deficiency, unspecified: Secondary | ICD-10-CM | POA: Diagnosis not present

## 2022-11-09 DIAGNOSIS — E103553 Type 1 diabetes mellitus with stable proliferative diabetic retinopathy, bilateral: Secondary | ICD-10-CM | POA: Diagnosis not present

## 2022-11-09 DIAGNOSIS — Z9483 Pancreas transplant status: Secondary | ICD-10-CM | POA: Diagnosis not present

## 2022-11-09 DIAGNOSIS — M898X9 Other specified disorders of bone, unspecified site: Secondary | ICD-10-CM | POA: Diagnosis not present

## 2022-11-09 DIAGNOSIS — Z940 Kidney transplant status: Secondary | ICD-10-CM | POA: Diagnosis not present

## 2022-11-09 DIAGNOSIS — Z5181 Encounter for therapeutic drug level monitoring: Secondary | ICD-10-CM | POA: Diagnosis not present

## 2022-11-14 ENCOUNTER — Ambulatory Visit (INDEPENDENT_AMBULATORY_CARE_PROVIDER_SITE_OTHER): Payer: Medicare Other | Admitting: Orthopedic Surgery

## 2022-11-14 ENCOUNTER — Encounter: Payer: Self-pay | Admitting: Orthopedic Surgery

## 2022-11-14 DIAGNOSIS — Z89511 Acquired absence of right leg below knee: Secondary | ICD-10-CM

## 2022-11-14 DIAGNOSIS — Z89512 Acquired absence of left leg below knee: Secondary | ICD-10-CM | POA: Diagnosis not present
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
## 2022-11-15 DIAGNOSIS — Z89512 Acquired absence of left leg below knee: Secondary | ICD-10-CM | POA: Diagnosis not present

## 2022-11-15 DIAGNOSIS — L97922 Non-pressure chronic ulcer of unspecified part of left lower leg with fat layer exposed: Secondary | ICD-10-CM | POA: Diagnosis not present

## 2022-11-15 DIAGNOSIS — Z940 Kidney transplant status: Secondary | ICD-10-CM | POA: Diagnosis not present

## 2022-11-15 DIAGNOSIS — E10622 Type 1 diabetes mellitus with other skin ulcer: Secondary | ICD-10-CM | POA: Diagnosis not present

## 2022-11-15 DIAGNOSIS — Z794 Long term (current) use of insulin: Secondary | ICD-10-CM | POA: Diagnosis not present

## 2022-11-29 DIAGNOSIS — E10622 Type 1 diabetes mellitus with other skin ulcer: Secondary | ICD-10-CM | POA: Diagnosis not present

## 2022-11-29 DIAGNOSIS — L97922 Non-pressure chronic ulcer of unspecified part of left lower leg with fat layer exposed: Secondary | ICD-10-CM | POA: Diagnosis not present

## 2022-11-29 DIAGNOSIS — Z940 Kidney transplant status: Secondary | ICD-10-CM | POA: Diagnosis not present

## 2022-11-29 DIAGNOSIS — Z89512 Acquired absence of left leg below knee: Secondary | ICD-10-CM | POA: Diagnosis not present

## 2022-11-29 DIAGNOSIS — Z794 Long term (current) use of insulin: Secondary | ICD-10-CM | POA: Diagnosis not present

## 2022-12-20 DIAGNOSIS — Z7952 Long term (current) use of systemic steroids: Secondary | ICD-10-CM | POA: Diagnosis not present

## 2022-12-20 DIAGNOSIS — Z89512 Acquired absence of left leg below knee: Secondary | ICD-10-CM | POA: Diagnosis not present

## 2022-12-20 DIAGNOSIS — L97922 Non-pressure chronic ulcer of unspecified part of left lower leg with fat layer exposed: Secondary | ICD-10-CM | POA: Diagnosis not present

## 2022-12-20 DIAGNOSIS — L89893 Pressure ulcer of other site, stage 3: Secondary | ICD-10-CM | POA: Diagnosis not present

## 2022-12-20 DIAGNOSIS — L97822 Non-pressure chronic ulcer of other part of left lower leg with fat layer exposed: Secondary | ICD-10-CM | POA: Diagnosis not present

## 2023-01-03 DIAGNOSIS — L97922 Non-pressure chronic ulcer of unspecified part of left lower leg with fat layer exposed: Secondary | ICD-10-CM | POA: Diagnosis not present

## 2023-01-03 DIAGNOSIS — L97822 Non-pressure chronic ulcer of other part of left lower leg with fat layer exposed: Secondary | ICD-10-CM | POA: Diagnosis not present

## 2023-01-03 DIAGNOSIS — Z7952 Long term (current) use of systemic steroids: Secondary | ICD-10-CM | POA: Diagnosis not present

## 2023-01-17 DIAGNOSIS — Z7952 Long term (current) use of systemic steroids: Secondary | ICD-10-CM | POA: Diagnosis not present

## 2023-01-17 DIAGNOSIS — L97922 Non-pressure chronic ulcer of unspecified part of left lower leg with fat layer exposed: Secondary | ICD-10-CM | POA: Diagnosis not present

## 2023-01-17 DIAGNOSIS — L97822 Non-pressure chronic ulcer of other part of left lower leg with fat layer exposed: Secondary | ICD-10-CM | POA: Diagnosis not present
